# Patient Record
Sex: Male | Born: 1965 | Hispanic: No | Smoking: Never smoker
Health system: Southern US, Community
[De-identification: ages and names within clinical notes are randomized; demographics above are authoritative.]

## PROBLEM LIST (undated history)

## (undated) HISTORY — PX: FRACTURE SURGERY: SHX138

---

## 1999-10-12 ENCOUNTER — Encounter: Payer: Self-pay | Admitting: Emergency Medicine

## 1999-10-12 ENCOUNTER — Emergency Department (HOSPITAL_COMMUNITY): Admission: EM | Admit: 1999-10-12 | Discharge: 1999-10-12 | Payer: Self-pay | Admitting: Emergency Medicine

## 2005-06-28 ENCOUNTER — Ambulatory Visit: Payer: Self-pay | Admitting: Internal Medicine

## 2012-01-30 ENCOUNTER — Ambulatory Visit (INDEPENDENT_AMBULATORY_CARE_PROVIDER_SITE_OTHER): Payer: BC Managed Care – PPO | Admitting: Family Medicine

## 2012-01-30 VITALS — BP 118/70 | HR 102 | Temp 98.2°F | Resp 16 | Ht 71.0 in | Wt 174.0 lb

## 2012-01-30 DIAGNOSIS — E785 Hyperlipidemia, unspecified: Secondary | ICD-10-CM

## 2012-01-30 LAB — POCT CBC
Granulocyte percent: 68.2 %G (ref 37–80)
HCT, POC: 49.4 % (ref 43.5–53.7)
Hemoglobin: 15.7 g/dL (ref 14.1–18.1)
Lymph, poc: 2.1 (ref 0.6–3.4)
MCH, POC: 29.5 pg (ref 27–31.2)
MCHC: 31.8 g/dL (ref 31.8–35.4)
MCV: 92.9 fL (ref 80–97)
MID (cbc): 0.7 (ref 0–0.9)
MPV: 8.5 fL (ref 0–99.8)
POC Granulocyte: 5.9 (ref 2–6.9)
POC LYMPH PERCENT: 23.9 %L (ref 10–50)
POC MID %: 7.9 %M (ref 0–12)
Platelet Count, POC: 235 10*3/uL (ref 142–424)
RBC: 5.32 M/uL (ref 4.69–6.13)
RDW, POC: 13 %
WBC: 8.7 10*3/uL (ref 4.6–10.2)

## 2012-01-30 LAB — COMPREHENSIVE METABOLIC PANEL
ALT: 20 U/L (ref 0–53)
AST: 19 U/L (ref 0–37)
Albumin: 4.6 g/dL (ref 3.5–5.2)
Alkaline Phosphatase: 79 U/L (ref 39–117)
BUN: 12 mg/dL (ref 6–23)
CO2: 31 mEq/L (ref 19–32)
Calcium: 10.1 mg/dL (ref 8.4–10.5)
Chloride: 102 mEq/L (ref 96–112)
Creat: 1.13 mg/dL (ref 0.50–1.35)
Glucose, Bld: 79 mg/dL (ref 70–99)
Potassium: 4.4 mEq/L (ref 3.5–5.3)
Sodium: 140 mEq/L (ref 135–145)
Total Bilirubin: 1.1 mg/dL (ref 0.3–1.2)
Total Protein: 7.6 g/dL (ref 6.0–8.3)

## 2012-01-30 LAB — LIPID PANEL
Cholesterol: 165 mg/dL (ref 0–200)
HDL: 53 mg/dL (ref >39)
LDL Cholesterol: 98 mg/dL (ref 0–99)
Total CHOL/HDL Ratio: 3.1 Ratio
Triglycerides: 70 mg/dL (ref <150)
VLDL: 14 mg/dL (ref 0–40)

## 2012-01-30 NOTE — Progress Notes (Signed)
Here to have labs drawn for a CPE- he plans to have his physical in August.   We have been working to improve his cholesterol profile- he tends to have a very low HDL, but he has improved his LDL a lot with lifestyle changes.    1. Hyperlipidemia  POCT CBC, Comprehensive metabolic panel, TSH, Lipid panel, PSA   Ordered labs as above.  Will follow up with labs when available.

## 2012-01-31 LAB — PSA: PSA: 0.67 ng/mL (ref <=4.00)

## 2012-01-31 LAB — TSH: TSH: 1.282 u[IU]/mL (ref 0.350–4.500)

## 2012-02-01 ENCOUNTER — Encounter: Payer: Self-pay | Admitting: Family Medicine

## 2012-02-24 ENCOUNTER — Encounter: Payer: Self-pay | Admitting: Family Medicine

## 2012-02-24 ENCOUNTER — Ambulatory Visit (INDEPENDENT_AMBULATORY_CARE_PROVIDER_SITE_OTHER): Payer: BC Managed Care – PPO | Admitting: Family Medicine

## 2012-02-24 VITALS — BP 114/58 | HR 73 | Temp 98.6°F | Resp 16 | Ht 71.5 in | Wt 181.6 lb

## 2012-02-24 DIAGNOSIS — Z0000 Encounter for general adult medical examination without abnormal findings: Secondary | ICD-10-CM

## 2012-02-24 DIAGNOSIS — I4510 Unspecified right bundle-branch block: Secondary | ICD-10-CM

## 2012-02-24 NOTE — Progress Notes (Signed)
[facility] [address] [place] [address] [phone]  Date:  02/24/2012   Name:  Patrick Larsen   DOB:  Sep 01, 1965   MRN:  [id number]  PCP:  Abbe Amsterdam, MD    Chief Complaint: Annual Exam   History of Present Illness:  Patrick Larsen is a 46 y.o. very pleasant male patient who presents with the following:  Here today for a CPE.  He has a history of hyperlipidemia, but he has controlled this with diet and exercise.  He is a Stage manager, and also works in Location manager.  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  He is married and has a 51 year old daughter.    Patrick Larsen has no particular concerns today, but he would like to have an EKG for his records.  Her thinks that he may have been told that his EKG was abnormal in the past, but he has never had any syncope/ near syncope or CP.   Tetanus is UTD, and labs done at his visit last month .  There is no problem list on file for this patient.   No past medical history on file.  No past surgical history on file.  History  Substance Use Topics  . Smoking status: Never Smoker   . Smokeless tobacco: Not on file  . Alcohol Use: Not on file    No family history on file.  No Known Allergies  Medication list has been reviewed and updated.  Current Outpatient Prescriptions on File Prior to Visit  Medication Sig Dispense Refill  . Multiple Vitamin (MULTIVITAMIN) tablet Take 3 tablets by mouth daily.      . Omega-3 Fatty Acids (FISH OIL CONCENTRATE) 1000 MG CAPS Take 2,000 capsules by mouth daily.      . vitamin C (ASCORBIC ACID) 500 MG tablet Take 500 mg by mouth daily.        Review of Systems:  As per HPI- otherwise negative.   Physical Examination: Filed Vitals:   02/24/12 1320  BP: 114/58  Pulse: 73  Temp: 98.6 F (37 C)  Resp: 16   Filed Vitals:   02/24/12 1320  Height: 5' 11.5" (1.816 m)  Weight: 181 lb 9.6 oz (82.373 kg)   Body mass index is 24.97  kg/(m^2). Ideal Body Weight: Weight in (lb) to have BMI = 25: 181.4   GEN: WDWN, NAD, Non-toxic, A & O x 3 HEENT: Atraumatic, Normocephalic. Neck supple. No masses, No LAD.  TM and oropharynx wnl, PEERL, EOMI Ears and Nose: No external deformity. CV: RRR, No M/G/R. No JVD. No thrill. No extra heart sounds. PULM: CTA B, no wheezes, crackles, rhonchi. No retractions. No resp. distress. No accessory muscle use. ABD: S, NT, ND, +BS. No rebound. No HSM. EXTR: No c/c/e NEURO Normal gait.  PSYCH: Normally interactive. Conversant. Not depressed or anxious appearing.  Calm demeanor.  GU: normal penis and scrotum/ normal DRE  Labs from last month:   Office Visit on 01/30/2012  Component Date Value Range Status  . WBC 01/30/2012 8.7  4.6 - 10.2 K/uL Final  . Lymph, poc 01/30/2012 2.1  0.6 - 3.4 Final  . POC LYMPH PERCENT 01/30/2012 23.9  10 - 50 %L Final  . MID (cbc) 01/30/2012 0.7  0 - 0.9 Final  . POC MID % 01/30/2012 7.9  0 - 12 %M Final  . POC Granulocyte 01/30/2012 5.9  2 - 6.9 Final  . Granulocyte percent 01/30/2012 68.2  37 - 80 %G Final  . RBC 01/30/2012 5.32  4.69 - 6.13 M/uL Final  . Hemoglobin 01/30/2012 15.7  14.1 - 18.1 g/dL Final  . HCT, POC 16/04/9603 49.4  43.5 - 53.7 % Final  . MCV 01/30/2012 92.9  80 - 97 fL Final  . MCH, POC 01/30/2012 29.5  27 - 31.2 pg Final  . MCHC 01/30/2012 31.8  31.8 - 35.4 g/dL Final  . RDW, POC 54/03/8118 13.0   Final  . Platelet Count, POC 01/30/2012 235  142 - 424 K/uL Final  . MPV 01/30/2012 8.5  0 - 99.8 fL Final  . Sodium 01/30/2012 140  135 - 145 mEq/L Final  . Potassium 01/30/2012 4.4  3.5 - 5.3 mEq/L Final  . Chloride 01/30/2012 102  96 - 112 mEq/L Final  . CO2 01/30/2012 31  19 - 32 mEq/L Final  . Glucose, Bld 01/30/2012 79  70 - 99 mg/dL Final  . BUN 14/78/2956 12  6 - 23 mg/dL Final  . Creat 21/30/8657 1.13  0.50 - 1.35 mg/dL Final  . Total Bilirubin 01/30/2012 1.1  0.3 - 1.2 mg/dL Final  . Alkaline Phosphatase 01/30/2012 79  39 -  117 U/L Final  . AST 01/30/2012 19  0 - 37 U/L Final  . ALT 01/30/2012 20  0 - 53 U/L Final  . Total Protein 01/30/2012 7.6  6.0 - 8.3 g/dL Final  . Albumin 84/69/6295 4.6  3.5 - 5.2 g/dL Final  . Calcium 28/41/3244 10.1  8.4 - 10.5 mg/dL Final  . TSH 07/17/7251 1.282  0.350 - 4.500 uIU/mL Final  . Cholesterol 01/30/2012 165  0 - 200 mg/dL Final   Comment: ATP III Classification:                                < 200        mg/dL        Desirable                               200 - 239     mg/dL        Borderline High                               >= 240        mg/dL        High                             . Triglycerides 01/30/2012 70  <150 mg/dL Final  . HDL 66/44/0347 53  >39 mg/dL Final  . Total CHOL/HDL Ratio 01/30/2012 3.1   Final  . VLDL 01/30/2012 14  0 - 40 mg/dL Final  . LDL Cholesterol 01/30/2012 98  0 - 99 mg/dL Final   Comment:                            Total Cholesterol/HDL Ratio:CHD Risk                                                 Coronary Heart Disease  Risk Table                                                                 Men       Women                                   1/2 Average Risk              3.4        3.3                                       Average Risk              5.0        4.4                                    2X Average Risk              9.6        7.1                                    3X Average Risk             23.4       11.0                          Use the calculated Patient Ratio above and the CHD Risk table                           to determine the patient's CHD Risk.                          ATP III Classification (LDL):                                < 100        mg/dL         Optimal                               100 - 129     mg/dL         Near or Above Optimal                               130 - 159     mg/dL         Borderline High                               160 - 189     mg/dL  High                                >  190        mg/dL         Very High                             . PSA 01/30/2012 0.67  <=4.00 ng/mL Final   Comment: Test Methodology: ECLIA PSA (Electrochemiluminescence Immunoassay)                                                     For PSA values from 2.5-4.0, particularly in younger men <60 years                          old, the AUA and NCCN suggest testing for % Free PSA (3515) and                          evaluation of the rate of increase in PSA (PSA velocity).   EKG: sinus rhythm, RBBB  Assessment and Plan: 1. Physical exam, annual  EKG 12-Lead   Performed age- appropriate exam today.  Patrick Larsen is concerned about his EKG findings today.  As he is very active he would like to have further evaluation to be sure there is nothing to be concerned about.  Will refer non- urgently to cardiology for an evaluation.    Abbe Amsterdam, MD

## 2012-06-29 ENCOUNTER — Telehealth: Payer: Self-pay | Admitting: Radiology

## 2012-06-29 NOTE — Telephone Encounter (Signed)
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 Please see me, I have the form. Alinah Sheard

## 2012-07-01 ENCOUNTER — Telehealth: Payer: Self-pay | Admitting: Family Medicine

## 2012-07-01 ENCOUNTER — Telehealth: Payer: Self-pay | Admitting: *Deleted

## 2012-07-01 NOTE — Telephone Encounter (Signed)
Patrick Larsen would like for [place] to complete a PD physical agility permission form.  He has actually done this exam several times in the past.  We were going to refer him to cardiology per his request for an EKG abnormality earlier this year.  He has no history of CP even with vigorous exercise or of CAD.  Dr. Patty Sermons at [facility] was kind enough to look at his EKG- BBB, does not need further evaluation as long as no symptoms.  Will complete form for [name] today and let him know.

## 2012-07-01 NOTE — Telephone Encounter (Signed)
Pt informed to come pick up physician statement form.

## 2012-07-15 HISTORY — PX: FRACTURE SURGERY: SHX138

## 2012-10-01 ENCOUNTER — Telehealth: Payer: Self-pay

## 2012-10-01 NOTE — Telephone Encounter (Signed)
Wants to know if something on his EKG would have an effect on a polygraph test.    [phone]

## 2012-10-01 NOTE — Telephone Encounter (Signed)
Patient has bundle branch block right which shows up on EKG< wants to know if this would have any effects on the outcome of polygraph. I think not, please advise.

## 2012-10-02 NOTE — Telephone Encounter (Signed)
Not that I am aware of. Just tell the truth.

## 2012-10-02 NOTE — Telephone Encounter (Signed)
Patient advised.

## 2012-11-10 ENCOUNTER — Other Ambulatory Visit: Payer: Self-pay | Admitting: Occupational Medicine

## 2012-11-10 ENCOUNTER — Ambulatory Visit
Admission: RE | Admit: 2012-11-10 | Discharge: 2012-11-10 | Disposition: A | Discharge status: Home / Self Care | Payer: No Typology Code available for payment source | Source: Ambulatory Visit | Attending: Occupational Medicine | Admitting: Occupational Medicine

## 2012-11-10 DIAGNOSIS — Z021 Encounter for pre-employment examination: Secondary | ICD-10-CM

## 2013-05-31 ENCOUNTER — Ambulatory Visit
Admission: RE | Admit: 2013-05-31 | Discharge: 2013-05-31 | Disposition: A | Discharge status: Home / Self Care | Payer: 59 | Source: Ambulatory Visit | Attending: Family | Admitting: Family

## 2013-05-31 ENCOUNTER — Other Ambulatory Visit: Payer: Self-pay | Admitting: Family

## 2013-05-31 DIAGNOSIS — R52 Pain, unspecified: Secondary | ICD-10-CM

## 2013-06-28 ENCOUNTER — Ambulatory Visit: Payer: Self-pay

## 2013-06-28 ENCOUNTER — Other Ambulatory Visit: Payer: Self-pay | Admitting: Occupational Medicine

## 2013-06-28 DIAGNOSIS — R52 Pain, unspecified: Secondary | ICD-10-CM

## 2015-08-04 ENCOUNTER — Encounter: Payer: Self-pay | Admitting: Family Medicine

## 2015-08-09 ENCOUNTER — Encounter: Payer: Self-pay | Admitting: Family Medicine

## 2015-08-28 ENCOUNTER — Ambulatory Visit (INDEPENDENT_AMBULATORY_CARE_PROVIDER_SITE_OTHER): Payer: Commercial Managed Care - HMO | Admitting: Family Medicine

## 2015-08-28 VITALS — BP 136/79 | HR 92 | Temp 99.0°F | Resp 20 | Ht 72.0 in | Wt 202.6 lb

## 2015-08-28 DIAGNOSIS — G8929 Other chronic pain: Secondary | ICD-10-CM | POA: Diagnosis not present

## 2015-08-28 DIAGNOSIS — M5432 Sciatica, left side: Secondary | ICD-10-CM

## 2015-08-28 DIAGNOSIS — M545 Low back pain, unspecified: Secondary | ICD-10-CM | POA: Insufficient documentation

## 2015-08-28 NOTE — Progress Notes (Signed)
   Subjective:    Patient ID: Patrick Larsen, male    DOB: September 19, 1965, 51 y.o.   MRN: [id number] By signing my name below, I, Littie Deeds, attest that this documentation has been prepared under the direction and in the presence of Elvina Sidle, MD.  Electronically Signed: Littie Deeds, Medical Scribe. 08/28/2015. 11:56 AM.  HPI HPI Comments: Patrick Larsen is a 50 y.o. male who presents to the [facility] complaining of gradual onset, intermittent, left lower back pain radiating into his left leg that started about a year ago. 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 The pain is not worse with palpation. Patient also exercises through [employer] and also works on cardio and core exercises. He would prefer not to miss any work and would like to try a duty belt suspension system that would place more of the weight onto his shoulders.   Review of Systems  Musculoskeletal: Positive for back pain.       Objective:   Physical Exam CONSTITUTIONAL: Well developed/well nourished HEAD: Normocephalic/atraumatic EYES: EOM/PERRL ENMT: Mucous membranes moist NECK: supple no meningeal signs SPINE: Straightly leg raising positive at 80 degrees on the left only. Non-tender on his back. Spine shows no scoliotic changes. CV: S1/S2 noted, no murmurs/rubs/gallops noted LUNGS: Lungs are clear to auscultation bilaterally, no apparent distress ABDOMEN: soft, nontender, no rebound or guarding GU: no cva tenderness NEURO: Pt is awake/alert, moves all extremitiesx4. Strength and muscle tone are normal on the left and right. EXTREMITIES: pulses normal, full ROM SKIN: warm, color normal PSYCH: no abnormalities of mood noted     Assessment & Plan:  Duty belt suspension system and return in 2 weeks.This chart was scribed in my presence and reviewed by me personally. This chart was scribed in my  presence and reviewed by me personally.    ICD-9-CM ICD-10-CM   1. Chronic low back pain 724.2 M54.5    338.29 G89.29   2. Sciatica of left side 724.3 M54.32      Signed, Elvina Sidle, MD   Signed, Elvina Sidle, MD

## 2015-08-28 NOTE — Patient Instructions (Signed)
Let's try the duty belt suspension system for the next 2 weeks and then have you return for reevaluation with Dr. Patsy Lager.

## 2015-09-06 ENCOUNTER — Ambulatory Visit (INDEPENDENT_AMBULATORY_CARE_PROVIDER_SITE_OTHER): Payer: Commercial Managed Care - HMO | Admitting: Family Medicine

## 2015-09-06 ENCOUNTER — Encounter: Payer: Self-pay | Admitting: Family Medicine

## 2015-09-06 VITALS — BP 128/76 | HR 71 | Temp 98.2°F | Ht 72.0 in | Wt 202.2 lb

## 2015-09-06 DIAGNOSIS — M5432 Sciatica, left side: Secondary | ICD-10-CM | POA: Diagnosis not present

## 2015-09-06 NOTE — Progress Notes (Signed)
[facility] [address] [place], [place] [address] [phone]  Date:  09/06/2015   Name:  Patrick Larsen   DOB:  1965/12/02   MRN:  [id number]  PCP:  Abbe Amsterdam, MD    Chief Complaint: Back Pain   History of Present Illness:  Patrick Larsen is a 50 y.o. very pleasant male patient who presents with the following:  I have seen this pt in the past although it has been a few years.  recently seen at [facility] (10 days ago) with complaint of back pain. 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 He was having pain in his lower back and down his left leg At that time we tried a belt suspension system to help take the weight off his lower back  He feels that he is much better with this system.  He has used some aleve for his back- he used it for 6 days but no longer feels that he needs it  He does need a note to RTW at regular duty and to be allowed to use his belt system  He no longer has any signs of sciatica, no bowel or bladder dysfunction Overall he is doing great  Patient Active Problem List   Diagnosis Date Noted  . Chronic low back pain 08/28/2015    History reviewed. No pertinent past medical history.  Past Surgical History  Procedure Laterality Date  . Fracture surgery      Social History  Substance Use Topics  . Smoking status: Never Smoker   . Smokeless tobacco: None  . Alcohol Use: None    Family History  Problem Relation Age of Onset  . Cancer Mother     No Known Allergies  Medication list has been reviewed and updated.  Current Outpatient Prescriptions on File Prior to Visit  Medication Sig Dispense Refill  . Multiple Vitamin (MULTIVITAMIN) tablet Take 3 tablets by mouth daily.    . Omega-3 Fatty Acids (FISH OIL CONCENTRATE) 1000 MG CAPS Take 2,000 capsules by mouth daily.    . vitamin C (ASCORBIC ACID) 500 MG tablet Take 500 mg by mouth  daily.     No current facility-administered medications on file prior to visit.    Review of Systems:  As per HPI- otherwise negative.   Physical Examination: Filed Vitals:   09/06/15 1132  BP: 128/76  Pulse: 71  Temp: 98.2 F (36.8 C)   Filed Vitals:   09/06/15 1132  Height: 6' (1.829 m)  Weight: 202 lb 3.2 oz (91.717 kg)   Body mass index is 27.42 kg/(m^2). Ideal Body Weight: Weight in (lb) to have BMI = 25: 183.9  GEN: WDWN, NAD, Non-toxic, A & O x 3 HEENT: Atraumatic, Normocephalic. Neck supple. No masses, No LAD. Ears and Nose: No external deformity. CV: RRR, No M/G/R. No JVD. No thrill. No extra heart sounds. PULM: CTA B, no wheezes, crackles, rhonchi. No retractions. No resp. distress. No accessory muscle use. EXTR: No c/c/e NEURO Normal gait.  PSYCH: Normally interactive. Conversant. Not depressed or anxious appearing.  Calm demeanor.  He indicates the left lower back and left sciatic notch as the areas that were bothering him- however he no longer has any tenderness.  Normal lumbar flexion and extension, normal BLE strength, sensation and DTR, negative SLR bilaterally    Assessment and Plan: Sciatica of left side  He is much better, sx are resolved nearly.  He will return to full duty and continue to use the belt system He will see me soon for a CPE  Signed Abbe Amsterdam, MD

## 2015-09-06 NOTE — Patient Instructions (Signed)
It was good to see you again today- let me know if your back symptoms return!  Otherwise we are glad to see you for a physical at your convenience

## 2015-12-07 ENCOUNTER — Ambulatory Visit (INDEPENDENT_AMBULATORY_CARE_PROVIDER_SITE_OTHER): Payer: Commercial Managed Care - HMO | Admitting: Family Medicine

## 2015-12-07 ENCOUNTER — Encounter: Payer: Self-pay | Admitting: Family Medicine

## 2015-12-07 VITALS — BP 136/73 | HR 71 | Temp 98.1°F | Ht 72.0 in | Wt 199.0 lb

## 2015-12-07 DIAGNOSIS — Z130 Encounter for screening for diseases of the blood and blood-forming organs and certain disorders involving the immune mechanism: Secondary | ICD-10-CM

## 2015-12-07 DIAGNOSIS — Z131 Encounter for screening for diabetes mellitus: Secondary | ICD-10-CM

## 2015-12-07 DIAGNOSIS — Z0000 Encounter for general adult medical examination without abnormal findings: Secondary | ICD-10-CM

## 2015-12-07 DIAGNOSIS — Z13220 Encounter for screening for lipoid disorders: Secondary | ICD-10-CM

## 2015-12-07 DIAGNOSIS — Z125 Encounter for screening for malignant neoplasm of prostate: Secondary | ICD-10-CM | POA: Diagnosis not present

## 2015-12-07 LAB — COMPREHENSIVE METABOLIC PANEL
ALBUMIN: 4.3 g/dL (ref 3.5–5.2)
ALK PHOS: 72 U/L (ref 39–117)
ALT: 19 U/L (ref 0–53)
AST: 20 U/L (ref 0–37)
BILIRUBIN TOTAL: 0.6 mg/dL (ref 0.2–1.2)
BUN: 11 mg/dL (ref 6–23)
CO2: 32 mEq/L (ref 19–32)
CREATININE: 1.26 mg/dL (ref 0.40–1.50)
Calcium: 9.7 mg/dL (ref 8.4–10.5)
Chloride: 102 mEq/L (ref 96–112)
GFR: 78.2 mL/min (ref >60.00)
Glucose, Bld: 81 mg/dL (ref 70–99)
Potassium: 3.7 mEq/L (ref 3.5–5.1)
SODIUM: 138 meq/L (ref 135–145)
TOTAL PROTEIN: 6.7 g/dL (ref 6.0–8.3)

## 2015-12-07 LAB — PSA: PSA: 0.59 ng/mL (ref 0.10–4.00)

## 2015-12-07 LAB — CBC
HCT: 43.5 % (ref 39.0–52.0)
Hemoglobin: 14.3 g/dL (ref 13.0–17.0)
MCHC: 32.8 g/dL (ref 30.0–36.0)
MCV: 89.7 fl (ref 78.0–100.0)
PLATELETS: 220 10*3/uL (ref 150.0–400.0)
RBC: 4.85 Mil/uL (ref 4.22–5.81)
RDW: 13.9 % (ref 11.5–15.5)
WBC: 7.3 10*3/uL (ref 4.0–10.5)

## 2015-12-07 LAB — HEMOGLOBIN A1C: HEMOGLOBIN A1C: 5.3 % (ref 4.6–6.5)

## 2015-12-07 LAB — LIPID PANEL
CHOLESTEROL: 179 mg/dL (ref 0–200)
HDL: 50.1 mg/dL (ref >39.00)
LDL CALC: 117 mg/dL — AB (ref 0–99)
NonHDL: 128.76
Total CHOL/HDL Ratio: 4
Triglycerides: 58 mg/dL (ref 0.0–149.0)
VLDL: 11.6 mg/dL (ref 0.0–40.0)

## 2015-12-07 NOTE — Progress Notes (Signed)
Pre visit review using our clinic review tool, if applicable. No additional management support is needed unless otherwise documented below in the visit note. 

## 2015-12-07 NOTE — Patient Instructions (Signed)
It was great to see you today- I will be in touch with your labs but it seems that you are doing great- keep up the good work!

## 2015-12-07 NOTE — Progress Notes (Signed)
[facility] [address] [place], [place] [id number] [phone] [phone]  Date:  12/07/2015   Name:  Patrick AdieSean M Feinstein   DOB:  03/22/1966   MRN:  [id number]  PCP:  Abbe AmsterdamOPLAND,Pierre Dellarocco, MD    Chief Complaint: Annual Exam   History of Present Illness:  Patrick Larsen is a 50 y.o. very pleasant male patient who presents with the following:  Here today for a physical exam. 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 Chronic low back pain 08/28/2015    No past medical history on file.  Past Surgical History  Procedure Laterality Date  . Fracture surgery      Social History  Substance Use Topics  . Smoking status: Never Smoker   . Smokeless tobacco: None  . Alcohol Use: None    Family History  Problem Relation Age of Onset  . Cancer Mother     No Known Allergies  Medication list has been reviewed and updated.  Current Outpatient Prescriptions on File Prior to Visit  Medication Sig Dispense Refill  . CALCIUM-MAGNESIUM-VITAMIN D PO Take 2 tablets by mouth daily.    . Multiple Vitamin (MULTIVITAMIN) tablet Take 3 tablets by mouth daily.    . Multiple Vitamin (THERA) TABS Take 2 tablets by mouth daily.    . Potassium 99 MG TABS Take 1 tablet by mouth daily.    . Probiotic Product (ACIDOPHILUS/GOAT MILK) CAPS Take 2 capsules by mouth daily.    . saw palmetto 500 MG  capsule Take 500 mg by mouth daily.    . vitamin C (ASCORBIC ACID) 500 MG tablet Take 500 mg by mouth daily.     No current facility-administered medications on file prior to visit.    Review of Systems:  As per HPI- otherwise negative.   Physical Examination: Filed Vitals:   12/07/15 1033  BP: 136/73  Pulse: 71  Temp: 98.1 F (36.7 C)   Filed Vitals:   12/07/15 1033  Height: 6' (1.829 m)  Weight: 199 lb (90.266 kg)   Body mass index is 26.98 kg/(m^2). Ideal Body Weight: Weight in (lb) to have BMI = 25: 183.9  GEN: WDWN, NAD, Non-toxic, A & O x 3, looks well, fit build HEENT: Atraumatic, Normocephalic. Neck supple. No masses, No LAD.  Bilateral TM wnl, oropharynx normal.  PEERL,EOMI.   Ears and Nose: No external deformity. CV: RRR, No M/G/R. No JVD. No thrill. No extra heart sounds. PULM: CTA B, no wheezes, crackles, rhonchi. No retractions. No resp. distress. No accessory muscle use. ABD: S, NT, ND. No rebound. No HSM. EXTR: No c/c/e NEURO Normal gait.  PSYCH: Normally interactive. Conversant. Not depressed or anxious appearing.  Calm demeanor.  Normal testicles and penis, normal DRE  Assessment and Plan:  Physical exam  Screening for diabetes mellitus - Plan: Comprehensive metabolic panel, Hemoglobin A1c  Screening for hyperlipidemia - Plan: Lipid panel  Screening for deficiency anemia - Plan: CBC  Screening for prostate cancer - Plan: PSA   Here today for a CPE He has a healthy lifestyle and no concerns today Encouraged him to continue exercise and healthy diet Labs today- Will plan further follow- up pending labs.  Signed Abbe Amsterdam, MD

## 2016-02-22 ENCOUNTER — Ambulatory Visit (INDEPENDENT_AMBULATORY_CARE_PROVIDER_SITE_OTHER): Payer: Commercial Managed Care - HMO | Admitting: Medical

## 2016-02-22 ENCOUNTER — Encounter: Payer: Self-pay | Admitting: Medical

## 2016-02-22 ENCOUNTER — Ambulatory Visit (HOSPITAL_BASED_OUTPATIENT_CLINIC_OR_DEPARTMENT_OTHER)
Admission: RE | Admit: 2016-02-22 | Discharge: 2016-02-22 | Disposition: A | Discharge status: Home / Self Care | Payer: Commercial Managed Care - HMO | Source: Ambulatory Visit | Attending: Medical | Admitting: Medical

## 2016-02-22 VITALS — BP 126/70 | HR 77 | Temp 98.0°F | Ht 72.0 in | Wt 204.8 lb

## 2016-02-22 DIAGNOSIS — M25572 Pain in left ankle and joints of left foot: Secondary | ICD-10-CM | POA: Insufficient documentation

## 2016-02-22 MED ORDER — DICLOFENAC SODIUM 75 MG PO TBEC
75.0000 mg | DELAYED_RELEASE_TABLET | Freq: Two times a day (BID) | ORAL | 0 refills | Status: DC
Start: 2016-02-22 — End: 2016-10-18

## 2016-02-22 NOTE — Progress Notes (Signed)
Pre visit review using our clinic review tool, if applicable. No additional management support is needed unless otherwise documented below in the visit note. 

## 2016-02-22 NOTE — Progress Notes (Signed)
   Subjective:    Patient ID: Patrick Larsen, male    DOB: 1965-12-22, 50 y.o.   MRN: [id number]  HPI  Pt in for evaluation of ankle pain. Pt was jogging yesterday. He describes inversion injury. He had to stop the jog. Pt can apply pressure but describes hobbled gait. Pt is Emergency planning/management officerpolice officer.   Pt took ibuprofen last night.     Review of Systems  Constitutional: Negative for chills, fatigue and fever.  Musculoskeletal:       Lt ankle pain lateral aspect.  Hematological: Negative for adenopathy. Bruises/bleeds easily.   No past medical history on file.   Social History   Social History  . Marital status: Married    Spouse name: N/A  . Number of children: N/A  . Years of education: N/A   Occupational History  . Not on file.   Social History Main Topics  . Smoking status: Never Smoker  . Smokeless tobacco: Not on file  . Alcohol use Not on file  . Drug use: Unknown  . Sexual activity: Not on file   Other Topics Concern  . Not on file   Social History Narrative  . No narrative on file    Past Surgical History:  Procedure Laterality Date  . FRACTURE SURGERY      Family History  Problem Relation Age of Onset  . Cancer Mother     No Known Allergies  Current Outpatient Prescriptions on File Prior to Visit  Medication Sig Dispense Refill  . CALCIUM-MAGNESIUM-VITAMIN D PO Take 2 tablets by mouth daily.    . Cholecalciferol (VITAMIN D3) 5000 units TABS Take 1 tablet by mouth daily.    . Multiple Vitamin (MULTIVITAMIN) tablet Take 3 tablets by mouth daily.    . Multiple Vitamin (THERA) TABS Take 2 tablets by mouth daily.    . Potassium 99 MG TABS Take 1 tablet by mouth daily.    . Probiotic Product (ACIDOPHILUS/GOAT MILK) CAPS Take 2 capsules by mouth daily.    . saw palmetto 500 MG capsule Take 500 mg by mouth daily.    . vitamin C (ASCORBIC ACID) 500 MG tablet Take 500 mg by mouth daily.     No current facility-administered medications on file prior to visit.       BP 126/70 (BP Location: Right Arm, Patient Position: Sitting, Cuff Size: Normal)   Pulse 77   Temp 98 F (36.7 C) (Oral)   Ht 6' (1.829 m)   Wt 204 lb 12.8 oz (92.9 kg)   SpO2 98%   BMI 27.78 kg/m       Objective:   Physical Exam  General- No acute distress. Pleasant patient.  Lt ankle- no some faint pain on rom lateral aspect. Lt foot- most of pt pain is proximal foot/4th and 5th metatarsal area. Mild swollen/faint bruise.          Assessment & Plan:  For your left ankle/foot region pain will get x-rays.  Will rx diclofenac.  RICE therapy.  Follow up  7 days or as needed.  Off work for  tomorrow. Return to regular duty on Wednesday. 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

## 2016-02-22 NOTE — Patient Instructions (Addendum)
For your left ankle/foot region pain will get x-rays.  Will rx diclofenac.  RICE therapy.  Follow up  7 days or as needed.

## 2016-02-22 NOTE — Progress Notes (Signed)
Pt has seen results on MyChart and message also sent for patient to call back if any questions.

## 2016-02-26 NOTE — Progress Notes (Signed)
Pt has seen results on MyChart and message also sent for patient to call back if any questions.

## 2016-02-28 ENCOUNTER — Encounter: Payer: Self-pay | Admitting: Medical

## 2016-02-28 ENCOUNTER — Ambulatory Visit (INDEPENDENT_AMBULATORY_CARE_PROVIDER_SITE_OTHER): Payer: Commercial Managed Care - HMO | Admitting: Medical

## 2016-02-28 VITALS — BP 126/61 | HR 68 | Temp 98.7°F | Ht 72.0 in | Wt 205.8 lb

## 2016-02-28 DIAGNOSIS — M25572 Pain in left ankle and joints of left foot: Secondary | ICD-10-CM | POA: Diagnosis not present

## 2016-02-28 NOTE — Progress Notes (Signed)
Pre visit review using our clinic tool,if applicable. No additional management support is needed unless otherwise documented below in the visit note.  

## 2016-02-28 NOTE — Progress Notes (Signed)
Subjective:    Patient ID: Patrick AdieSean M Larsen, male    DOB: 1966/02/17, 50 y.o.   MRN: [id number]  HPI   Pt in for follow up. He states he has been able to put some pressure on his left ankle. He tried to do light job/run. Pt states when he simulates run movements since last visit had level 5/10 pain.   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  Injury was on August 9th. Xray was done on the 10 th.  Pain described as in base of 4th metatarsal area.     Review of Systems  Constitutional: Negative for chills, fatigue and fever.  Musculoskeletal:       Left foot pain.  Skin: Negative for rash.  Hematological: Negative for adenopathy. Does not bruise/bleed easily.    No past medical history on file.   Social History   Social History  . Marital status: Married    Spouse name: N/A  . Number of children: N/A  . Years of education: N/A   Occupational History  . Not on file.   Social History Main Topics  . Smoking status: Never Smoker  . Smokeless tobacco: Not on file  . Alcohol use Not on file  . Drug use: Unknown  . Sexual activity: Not on file   Other Topics Concern  . Not on file   Social History Narrative  . No narrative on file    Past Surgical History:  Procedure Laterality Date  . FRACTURE SURGERY      Family History  Problem Relation Age of Onset  . Cancer Mother     No Known Allergies  Current Outpatient Prescriptions on File Prior to Visit  Medication Sig Dispense Refill  . CALCIUM-MAGNESIUM-VITAMIN D PO Take 2 tablets by mouth daily.    . Cholecalciferol (VITAMIN D3) 5000 units TABS Take 1 tablet by mouth daily.    . Multiple Vitamin (MULTIVITAMIN) tablet Take 3 tablets by mouth daily.    . Multiple Vitamin (THERA) TABS Take 2 tablets by mouth daily.    . Potassium 99 MG TABS Take 1 tablet by mouth daily.    . Probiotic Product (ACIDOPHILUS/GOAT MILK) CAPS Take 2 capsules by mouth daily.    .  saw palmetto 500 MG capsule Take 500 mg by mouth daily.    . vitamin C (ASCORBIC ACID) 500 MG tablet Take 500 mg by mouth daily.    . diclofenac (VOLTAREN) 75 MG EC tablet Take 1 tablet (75 mg total) by mouth 2 (two) times daily. (Patient not taking: Reported on 02/28/2016) 14 tablet 0   No current facility-administered medications on file prior to visit.     BP 126/61   Pulse 68   Temp 98.7 F (37.1 C) (Oral)   Ht 6' (1.829 m)   Wt 205 lb 12.8 oz (93.4 kg)   SpO2 100%   BMI 27.91 kg/m       Objective:   Physical Exam  General- nad.  Lt ankle- no swelling. No pain on palpation. No warmth.  Lt foot- mild- moderate pain on palpation proximal 4th metatarsal area. No bruising.       Assessment & Plan:  For your foot pain continue RICE therapy. Can use diclofenac if needed  Repeat xray of left foot this Friday to see if small stress fracture missed on initial xray in light of persisting pain.  Light duty excuse.  Refer to  sports medicine.  Follow up as needed with us after sports med referral.   Esperanza RichtersSaguier, Yandiel Bergum, PA-C

## 2016-02-28 NOTE — Patient Instructions (Addendum)
For your foot pain continue RICE therapy. Can use diclofenac if needed  Repeat xray of left foot this Friday to see if small stress fracture missed on initial xray in light of persisting pain.  Light duty excuse.  Refer to sports medicine.  Follow up as needed with us after sports med referral.

## 2016-02-29 ENCOUNTER — Ambulatory Visit (HOSPITAL_BASED_OUTPATIENT_CLINIC_OR_DEPARTMENT_OTHER)
Admission: RE | Admit: 2016-02-29 | Discharge: 2016-02-29 | Disposition: A | Discharge status: Home / Self Care | Payer: Commercial Managed Care - HMO | Source: Ambulatory Visit | Attending: Medical | Admitting: Medical

## 2016-02-29 ENCOUNTER — Ambulatory Visit (INDEPENDENT_AMBULATORY_CARE_PROVIDER_SITE_OTHER): Payer: Commercial Managed Care - HMO | Admitting: Family Medicine

## 2016-02-29 ENCOUNTER — Encounter: Payer: Self-pay | Admitting: Family Medicine

## 2016-02-29 DIAGNOSIS — S99922A Unspecified injury of left foot, initial encounter: Secondary | ICD-10-CM

## 2016-02-29 DIAGNOSIS — M25572 Pain in left ankle and joints of left foot: Secondary | ICD-10-CM | POA: Diagnosis present

## 2016-02-29 NOTE — Patient Instructions (Addendum)
You have a foot sprain (between cuboid and 5th metatarsal). Ice the area 15 minutes at a time 3-4 times a day. Try to avoid flat shoes, barefoot walking until this heals. Ibuprofen or aleve only if needed. Light duty for 2 weeks - you can call me sooner if you're feeling well, able to run and cut with minimal pain about a week from now and we can return you to full duty. Follow up with me in 2 weeks.

## 2016-03-04 DIAGNOSIS — S99922A Unspecified injury of left foot, initial encounter: Secondary | ICD-10-CM | POA: Insufficient documentation

## 2016-03-04 NOTE — Progress Notes (Signed)
PCP: Abbe AmsterdamOPLAND,JESSICA, MD  Consultation requested by: Esperanza RichtersEdward Saguier PA-C  Subjective:   HPI: Patient is a 50 y.o. male here for left foot pain.  Patient reports on 8/9 he was out jogging when he inverted his left ankle. Difficulty bearing weight after this but able to do so soon after this. Took 400mg  ibuprofen for 2 days but no medicines now. Focusing on rest, icing, elevation, compression. History of remote injury around 1988 to this ankle. Radiographs by PCP on 8/10 were negative. 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 Pain level 5/10 at worst, sharp lateral left foot. No skin changes, numbness.  No past medical history on file.  Current Outpatient Prescriptions on File Prior to Visit  Medication Sig Dispense Refill  . CALCIUM-MAGNESIUM-VITAMIN D PO Take 2 tablets by mouth daily.    . Cholecalciferol (VITAMIN D3) 5000 units TABS Take 1 tablet by mouth daily.    . diclofenac (VOLTAREN) 75 MG EC tablet Take 1 tablet (75 mg total) by mouth 2 (two) times daily. (Patient not taking: Reported on 02/28/2016) 14 tablet 0  . Multiple Vitamin (MULTIVITAMIN) tablet Take 3 tablets by mouth daily.    . Multiple Vitamin (THERA) TABS Take 2 tablets by mouth daily.    . Potassium 99 MG TABS Take 1 tablet by mouth daily.    . Probiotic Product (ACIDOPHILUS/GOAT MILK) CAPS Take 2 capsules by mouth daily.    . saw palmetto 500 MG capsule Take 500 mg by mouth daily.    . vitamin C (ASCORBIC ACID) 500 MG tablet Take 500 mg by mouth daily.     No current facility-administered medications on file prior to visit.     Past Surgical History:  Procedure Laterality Date  . FRACTURE SURGERY      No Known Allergies  Social History   Social History  . Marital status: Married    Spouse name: N/A  . Number of children: N/A  . Years of education: N/A   Occupational History  . Not on file.   Social History Main Topics  . Smoking status: Never Smoker  . Smokeless tobacco: Not on file  .  Alcohol use Not on file  . Drug use: Unknown  . Sexual activity: Not on file   Other Topics Concern  . Not on file   Social History Narrative  . No narrative on file    Family History  Problem Relation Age of Onset  . Cancer Mother     BP (!) 147/84   Ht 6\' 1"  (1.854 m)   Wt 205 lb (93 kg)   BMI 27.05 kg/m   Review of Systems: See HPI above.    Objective:  Physical Exam:  Gen: NAD, comfortable in exam room  Left foot/ankle: No gross deformity, swelling, ecchymoses FROM with mild pain external rotation. TTP base 5th metatarsal, TMT joint in this area.  No other tenderness. Negative ant drawer and talar tilt.   Negative syndesmotic compression. Thompsons test negative. NV intact distally.  Right ankle: FROM without pain.    MSK u/s left foot/ankle:  No abnormalities of 5th metatarsal, cuboid, peroneal tendons.  Assessment & Plan:  1. Left foot injury - Independently reviewed today's radiographs; performed and reviewed ultrasound also - no fracture, tendon abnormalities.  Consistent with midfoot sprain.  Icing, ibuprofen or aleve if needed.  Written for light duty for 2 more weeks though discussed may be improved prior to this.  F/u in 2 weeks.

## 2016-03-04 NOTE — Assessment & Plan Note (Signed)
Independently reviewed today's radiographs; performed and reviewed ultrasound also - no fracture, tendon abnormalities.  Consistent with midfoot sprain.  Icing, ibuprofen or aleve if needed.  Written for light duty for 2 more weeks though discussed may be improved prior to this.  F/u in 2 weeks.

## 2016-03-06 ENCOUNTER — Telehealth: Payer: Self-pay | Admitting: Family Medicine

## 2016-03-06 NOTE — Telephone Encounter (Signed)
Letter printed.

## 2016-10-18 ENCOUNTER — Ambulatory Visit (INDEPENDENT_AMBULATORY_CARE_PROVIDER_SITE_OTHER): Payer: Commercial Managed Care - HMO | Admitting: Medical

## 2016-10-18 ENCOUNTER — Encounter: Payer: Self-pay | Admitting: Medical

## 2016-10-18 VITALS — BP 134/67 | HR 74 | Temp 98.6°F | Resp 16 | Ht 73.0 in | Wt 197.8 lb

## 2016-10-18 DIAGNOSIS — M25569 Pain in unspecified knee: Secondary | ICD-10-CM | POA: Diagnosis not present

## 2016-10-18 DIAGNOSIS — S80212A Abrasion, left knee, initial encounter: Secondary | ICD-10-CM

## 2016-10-18 DIAGNOSIS — L089 Local infection of the skin and subcutaneous tissue, unspecified: Secondary | ICD-10-CM

## 2016-10-18 MED ORDER — MUPIROCIN 2 % EX OINT: TOPICAL_OINTMENT | CUTANEOUS | 0 refills | Status: DC

## 2016-10-18 NOTE — Patient Instructions (Addendum)
The abrasion area does appear to have possible  infection. Recommended  doxycycline oral antibiotic but declined. Did rx mupirocin but in event area worsens oral antibiotic would be required Strong caution given since over a joint.   For pain can use ibuprofen otc.  If you change you mind on xray let me know and will order.  When return to work cover with bandaid and ace wrap the area.   Follow up in 10 days or as needed.

## 2016-10-18 NOTE — Progress Notes (Signed)
Pre visit review using our clinic review tool, if applicable. No additional management support is needed unless otherwise documented below in the visit note. 

## 2016-10-18 NOTE — Progress Notes (Signed)
Subjective:    Patient ID: Patrick Larsen, male    DOB: 1965-12-09, 51 y.o.   MRN: [id number]  HPI  Pt in with mild  knee pain. Pt states he had injury walking his dog. He got dog leash wrapped around his leg. His 170 lb dog chased a ball and Maximilien hit the ground knee first.  Since then his knee hs been healing slowly. No severe pain. But he constantly re-cracks scab on range of motion. Recent faint dry yellow dc over scab.  Injury was one week ago.    Review of Systems  Constitutional: Negative for chills, fatigue and fever.  Respiratory: Negative for cough, chest tightness, shortness of breath and wheezing.   Cardiovascular: Negative for chest pain and palpitations.  Gastrointestinal: Negative for abdominal pain, constipation, nausea and vomiting.  Genitourinary: Negative for dysuria and flank pain.  Musculoskeletal:       Lt knee pain.  Skin: Negative for rash.  Neurological: Negative for dizziness, weakness and headaches.  Hematological: Negative for adenopathy. Does not bruise/bleed easily.    No past medical history on file.   Social History   Social History  . Marital status: Married    Spouse name: N/A  . Number of children: N/A  . Years of education: N/A   Occupational History  . Not on file.   Social History Main Topics  . Smoking status: Never Smoker  . Smokeless tobacco: Never Used  . Alcohol use Not on file  . Drug use: Unknown  . Sexual activity: Not on file   Other Topics Concern  . Not on file   Social History Narrative  . No narrative on file    Past Surgical History:  Procedure Laterality Date  . FRACTURE SURGERY      Family History  Problem Relation Age of Onset  . Cancer Mother     No Known Allergies  Current Outpatient Prescriptions on File Prior to Visit  Medication Sig Dispense Refill  . CALCIUM-MAGNESIUM-VITAMIN D PO Take 2 tablets by mouth daily.    . Cholecalciferol (VITAMIN D3) 5000 units TABS Take 1 tablet by mouth  daily.    . Multiple Vitamin (MULTIVITAMIN) tablet Take 3 tablets by mouth daily.    . Multiple Vitamin (THERA) TABS Take 2 tablets by mouth daily.    . Potassium 99 MG TABS Take 1 tablet by mouth daily.    . Probiotic Product (ACIDOPHILUS/GOAT MILK) CAPS Take 2 capsules by mouth daily.    . saw palmetto 500 MG capsule Take 500 mg by mouth daily.    . vitamin C (ASCORBIC ACID) 500 MG tablet Take 500 mg by mouth daily.     No current facility-administered medications on file prior to visit.     BP 134/67 (BP Location: Right Arm, Patient Position: Sitting, Cuff Size: Large)   Pulse 74   Temp 98.6 F (37 C) (Oral)   Resp 16   Ht  (1.854 m)   Wt 197 lb 12.8 oz (89.7 kg)   SpO2 95%   BMI 26.10 kg/m       Objective:   Physical Exam  General- No acute distress. Pleasant patient. Neck- Full range of motion, no jvd Lungs- Clear, even and unlabored. Heart- regular rate and rhythm. Neurologic- CNII- XII grossly intact.  Left knee- 1.5 cm x .5 cm scab mid patella. With faint redness at edges.(pt shows me picture of orignial wound and was a deep wound) Good rom. He has mild  pain directly over patella when moves. No instability.     Assessment & Plan:  The abrasion area does appear to have possible  infection. Recommended  doxycycline oral antibiotic but declined. Did rx mupirocin but in event area worsens oral antibiotic would be required Strong caution given since over a joint.   For pain can use ibuprofen otc.  If you change you mind on xray let me know and will order.  When return to work cover with bandaid and ace wrap the area.   Follow up in 10 days or as needed.

## 2017-01-07 NOTE — Progress Notes (Addendum)
[facility] [address] [place], [place] [address] [phone]  Date:  01/08/2017   Name:  Patrick Larsen   DOB:  17-Jun-1966   MRN:  [id number]  PCP:  Pearline Cables, MD    Chief Complaint: Annual Exam   History of Present Illness:  Patrick Larsen is a 51 y.o. 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  He does have some persistent lower back pain.  HPI from last year:  Here today for a physical exam. He is using a special suspension belt for his service belt at work- this has helped a lot with his back pain  His tetanus shot is UTD He is due for labs today- he is fasting since last night His family is doing well  He does check his BP at home and will generally get 120/68; he tends to get a little nervous at MD office He enjoys exercising at the gym; cardio and weights  He is still working for the [employer] His duty belt system does worry well for him His family is doing great- his daughter is starting 3rd grade this coming year.  She has a lot of interest in outer space and geography  They are traveling to [place] to see family in July.   He continues to be physically active and to eat well No CP or SOB  He did eat a little yogurt and banana early this am- is otherwise fasting for labs   He does have some physical aches and pains from his years of football and wrestling  He has noted a mole on his right face for the last couple of years which is a bit concerning to   His home BP continues to look very good- 120- 125/60s  Wt Readings from Last 3 Encounters:  01/08/17 197 lb 9.6 oz (89.6 kg)  10/18/16 197 lb 12.8 oz (89.7 kg)  02/29/16 205 lb (93 kg)   Lab Results  Component Value Date   PSA 0.59 12/07/2015   PSA 0.67 01/30/2012     Patient Active Problem List   Diagnosis Date Noted  . Injury of  left foot 03/04/2016  . Chronic low back pain 08/28/2015    No past medical history on file.  Past Surgical History:  Procedure Laterality Date  . FRACTURE SURGERY      Social History  Substance Use Topics  . Smoking status: Never Smoker  . Smokeless tobacco: Never Used  . Alcohol use Not on file    Family History  Problem Relation Age of Onset  . Cancer Mother     No Known Allergies  Medication list has been reviewed and updated.  Current Outpatient Prescriptions on File Prior to Visit  Medication Sig Dispense Refill  . CALCIUM-MAGNESIUM-VITAMIN D PO Take 2 tablets by mouth daily.    . Cholecalciferol (VITAMIN D3) 5000 units TABS Take 1 tablet by mouth daily.    . Multiple Vitamin (MULTIVITAMIN) tablet Take 3 tablets by mouth daily.    . Multiple Vitamin (THERA) TABS Take 2 tablets by mouth daily.    . Potassium 99 MG TABS Take 1 tablet by mouth daily.    . Probiotic Product (ACIDOPHILUS/GOAT MILK) CAPS Take 2 capsules by mouth daily.    . saw palmetto 500 MG capsule Take 500 mg by mouth daily.    Marland Kitchen  vitamin C (ASCORBIC ACID) 500 MG tablet Take 500 mg by mouth daily.     No current facility-administered medications on file prior to visit.     Review of Systems:  As per HPI- otherwise negative.   Physical Examination: Vitals:   01/08/17 1411  BP: 129/68  Pulse: 82  Temp: 98.7 F (37.1 C)   Vitals:   01/08/17 1411  Weight: 197 lb 9.6 oz (89.6 kg)  Height: 6\' 1"  (1.854 m)   Body mass index is 26.07 kg/m. Ideal Body Weight: Weight in (lb) to have BMI = 25: 189.1  GEN: WDWN, NAD, Non-toxic, A & O x 3, looks well today HEENT: Atraumatic, Normocephalic. Neck supple. No masses, No LAD.  Bilateral TM wnl, oropharynx normal.  PEERL,EOMI.   Ears and Nose: No external deformity. CV: RRR, No M/G/R. No JVD. No thrill. No extra heart sounds. PULM: CTA B, no wheezes, crackles, rhonchi. No retractions. No resp. distress. No accessory muscle use. ABD: S, NT, ND, +BS.  No rebound. No HSM. EXTR: No c/c/e NEURO Normal gait.  PSYCH: Normally interactive. Conversant. Not depressed or anxious appearing.  Calm demeanor.    Assessment and Plan: Physical exam  Screening for diabetes mellitus - Plan: Comprehensive metabolic panel, Hemoglobin A1c  Screening for hyperlipidemia - Plan: Lipid panel  Screening for deficiency anemia - Plan: CBC  Screening for prostate cancer - Plan: PSA  Here today for his annual CPE He is feeling well and has no concerns except for a small mole inferior to right ear, present for a couple of years.  He plans to come in to have this removed soon Otherwise encouraged continued healthy lifestyle, Will plan further follow- up pending labs.   Signed Abbe Amsterdam, MD  Labs returned as below Results for orders placed or performed in visit on 01/08/17  CBC  Result Value Ref Range   WBC 7.2 4.0 - 10.5 K/uL   RBC 4.63 4.22 - 5.81 Mil/uL   Platelets 219.0 150.0 - 400.0 K/uL   Hemoglobin 14.0 13.0 - 17.0 g/dL   HCT 16.1 09.6 - 04.5 %   MCV 88.9 78.0 - 100.0 fl   MCHC 34.0 30.0 - 36.0 g/dL   RDW 40.9 81.1 - 91.4 %  Comprehensive metabolic panel  Result Value Ref Range   Sodium 140 135 - 145 mEq/L   Potassium 4.2 3.5 - 5.1 mEq/L   Chloride 102 96 - 112 mEq/L   CO2 31 19 - 32 mEq/L   Glucose, Bld 78 70 - 99 mg/dL   BUN 10 6 - 23 mg/dL   Creatinine, Ser 7.82 0.40 - 1.50 mg/dL   Total Bilirubin 0.9 0.2 - 1.2 mg/dL   Alkaline Phosphatase 59 39 - 117 U/L   AST 21 0 - 37 U/L   ALT 18 0 - 53 U/L   Total Protein 6.7 6.0 - 8.3 g/dL   Albumin 4.4 3.5 - 5.2 g/dL   Calcium 95.6 8.4 - 21.3 mg/dL   GFR 08.65 >78.46 mL/min  Hemoglobin A1c  Result Value Ref Range   Hgb A1c MFr Bld 5.2 4.6 - 6.5 %  Lipid panel  Result Value Ref Range   Cholesterol 182 0 - 200 mg/dL   Triglycerides 96.2 0.0 - 149.0 mg/dL   HDL 95.28 >41.32 mg/dL   VLDL 44.0 0.0 - 10.2 mg/dL   LDL Cholesterol 725 (H) 0 - 99 mg/dL   Total CHOL/HDL Ratio 4     NonHDL 133.13   PSA  Result Value Ref Range   PSA 0.75 0.10 - 4.00 ng/mL   Lab Results  Component Value Date   PSA 0.75 01/08/2017   PSA 0.59 12/07/2015   PSA 0.67 01/30/2012

## 2017-01-08 ENCOUNTER — Ambulatory Visit (INDEPENDENT_AMBULATORY_CARE_PROVIDER_SITE_OTHER): Payer: 59 | Admitting: Family Medicine

## 2017-01-08 ENCOUNTER — Encounter: Payer: Self-pay | Admitting: Family Medicine

## 2017-01-08 VITALS — BP 129/68 | HR 82 | Temp 98.7°F | Ht 73.0 in | Wt 197.6 lb

## 2017-01-08 DIAGNOSIS — Z0000 Encounter for general adult medical examination without abnormal findings: Secondary | ICD-10-CM

## 2017-01-08 DIAGNOSIS — Z13220 Encounter for screening for lipoid disorders: Secondary | ICD-10-CM | POA: Diagnosis not present

## 2017-01-08 DIAGNOSIS — Z131 Encounter for screening for diabetes mellitus: Secondary | ICD-10-CM

## 2017-01-08 DIAGNOSIS — Z130 Encounter for screening for diseases of the blood and blood-forming organs and certain disorders involving the immune mechanism: Secondary | ICD-10-CM

## 2017-01-08 DIAGNOSIS — Z125 Encounter for screening for malignant neoplasm of prostate: Secondary | ICD-10-CM | POA: Diagnosis not present

## 2017-01-08 NOTE — Patient Instructions (Addendum)
It was a pleasure to see you today- I will be in touch with your labs asap  Continue to take good care of yourself!  Please schedule a 30 minute appt with me in the next few months and we can remove that mole from your face  I would recommend that you have colon cancer screening- Cologuard or colonoscopy are both good options.  Please think about this and let me know your preference I will then set up your testing

## 2017-01-09 ENCOUNTER — Encounter: Payer: Self-pay | Admitting: Family Medicine

## 2017-01-09 LAB — CBC
HEMATOCRIT: 41.2 % (ref 39.0–52.0)
HEMOGLOBIN: 14 g/dL (ref 13.0–17.0)
MCHC: 34 g/dL (ref 30.0–36.0)
MCV: 88.9 fl (ref 78.0–100.0)
Platelets: 219 10*3/uL (ref 150.0–400.0)
RBC: 4.63 Mil/uL (ref 4.22–5.81)
RDW: 13.1 % (ref 11.5–15.5)
WBC: 7.2 10*3/uL (ref 4.0–10.5)

## 2017-01-09 LAB — HEMOGLOBIN A1C: Hgb A1c MFr Bld: 5.2 % (ref 4.6–6.5)

## 2017-01-09 LAB — LIPID PANEL
CHOL/HDL RATIO: 4
Cholesterol: 182 mg/dL (ref 0–200)
HDL: 48.6 mg/dL (ref >39.00)
LDL Cholesterol: 115 mg/dL — ABNORMAL HIGH (ref 0–99)
NONHDL: 133.13
TRIGLYCERIDES: 90 mg/dL (ref 0.0–149.0)
VLDL: 18 mg/dL (ref 0.0–40.0)

## 2017-01-09 LAB — COMPREHENSIVE METABOLIC PANEL
ALK PHOS: 59 U/L (ref 39–117)
ALT: 18 U/L (ref 0–53)
AST: 21 U/L (ref 0–37)
Albumin: 4.4 g/dL (ref 3.5–5.2)
BUN: 10 mg/dL (ref 6–23)
CHLORIDE: 102 meq/L (ref 96–112)
CO2: 31 meq/L (ref 19–32)
Calcium: 10.3 mg/dL (ref 8.4–10.5)
Creatinine, Ser: 1.36 mg/dL (ref 0.40–1.50)
GFR: 71.29 mL/min (ref >60.00)
GLUCOSE: 78 mg/dL (ref 70–99)
POTASSIUM: 4.2 meq/L (ref 3.5–5.1)
SODIUM: 140 meq/L (ref 135–145)
TOTAL PROTEIN: 6.7 g/dL (ref 6.0–8.3)
Total Bilirubin: 0.9 mg/dL (ref 0.2–1.2)

## 2017-01-09 LAB — PSA: PSA: 0.75 ng/mL (ref 0.10–4.00)

## 2017-04-22 NOTE — Progress Notes (Deleted)
[facility] at [facility] [address], [address] [place], [place] [address] [phone]  Date:  04/23/2017   Name:  Patrick Larsen   DOB:  Jan 29, 1966   MRN:  [id number]  PCP:  Pearline Cables, MD    Chief Complaint: No chief complaint on file.   History of Present Illness:  Patrick Larsen is a 51 y.o. very pleasant male patient who presents with the following:  Here today to have a mole removed from the right side of his face Flu shot: due He is also now of age for a colonoscopy   Patient Active Problem List   Diagnosis Date Noted  . Injury of left foot 03/04/2016  . Chronic low back pain 08/28/2015    No past medical history on file.  Past Surgical History:  Procedure Laterality Date  . FRACTURE SURGERY      Social History  Substance Use Topics  . Smoking status: Never Smoker  . Smokeless tobacco: Never Used  . Alcohol use Not on file    Family History  Problem Relation Age of Onset  . Cancer Mother     No Known Allergies  Medication list has been reviewed and updated.  Current Outpatient Prescriptions on File Prior to Visit  Medication Sig Dispense Refill  . CALCIUM-MAGNESIUM-VITAMIN D PO Take 2 tablets by mouth daily.    . Cholecalciferol (VITAMIN D3) 5000 units TABS Take 1 tablet by mouth daily.    . Multiple Vitamin (MULTIVITAMIN) tablet Take 3 tablets by mouth daily.    . Multiple Vitamin (THERA) TABS Take 2 tablets by mouth daily.    . Omega-3 Fatty Acids (THE VERY FINEST FISH OIL) LIQD Take by mouth.    . Potassium 99 MG TABS Take 1 tablet by mouth daily.    . Probiotic Product (ACIDOPHILUS/GOAT MILK) CAPS Take 2 capsules by mouth daily.    . saw palmetto 500 MG capsule Take 500 mg by mouth daily.    . vitamin C (ASCORBIC ACID) 500 MG tablet Take 500 mg by mouth daily.     No current facility-administered medications on file prior to visit.     Review of Systems:  As per HPI- otherwise  negative.   Physical Examination: There were no vitals filed for this visit. There were no vitals filed for this visit. There is no height or weight on file to calculate BMI. Ideal Body Weight:    GEN: WDWN, NAD, Non-toxic, A & O x 3 HEENT: Atraumatic, Normocephalic. Neck supple. No masses, No LAD. Ears and Nose: No external deformity. CV: RRR, No M/G/R. No JVD. No thrill. No extra heart sounds. PULM: CTA B, no wheezes, crackles, rhonchi. No retractions. No resp. distress. No accessory muscle use. ABD: S, NT, ND, +BS. No rebound. No HSM. EXTR: No c/c/e NEURO Normal gait.  PSYCH: Normally interactive. Conversant. Not depressed or anxious appearing.  Calm demeanor.    Assessment and Plan: ***  Signed Abbe Amsterdam, MD

## 2017-04-23 ENCOUNTER — Ambulatory Visit: Payer: Self-pay | Admitting: Family Medicine

## 2017-04-27 NOTE — Progress Notes (Signed)
[facility] [address] [place], [place] [address] [phone]  Date:  04/28/2017   Name:  Patrick Larsen   DOB:  04/02/1966   MRN:  [id number]  PCP:  Pearline Cables, MD    Chief Complaint: Nevus (removal)   History of Present Illness:  Patrick Larsen is a 51 y.o. very pleasant male patient who presents with the following:  Would like to have a small mole on his right angle of jaw removed today It has been there for a few months- he was just a bit concerned about it and would like it to be removed.   No history of skin cancer  Also he has noted a left shoulder pain for the last few weeks- he has noted some aching in his shoulder, and it will seem to get stuck in place at time.  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 He now avoids lifting heavy weights, but does use kettlebells and nautilus machines to stay fit   Patient Active Problem List   Diagnosis Date Noted  . Injury of left foot 03/04/2016  . Chronic low back pain 08/28/2015    No past medical history on file.  Past Surgical History:  Procedure Laterality Date  . FRACTURE SURGERY      Social History  Substance Use Topics  . Smoking status: Never Smoker  . Smokeless tobacco: Never Used  . Alcohol use Not on file    Family History  Problem Relation Age of Onset  . Cancer Mother     No Known Allergies  Medication list has been reviewed and updated.  Current Outpatient Prescriptions on File Prior to Visit  Medication Sig Dispense Refill  . CALCIUM-MAGNESIUM-VITAMIN D PO Take 2 tablets by mouth daily.    . Cholecalciferol (VITAMIN D3) 5000 units TABS Take 1 tablet by mouth daily.    . Multiple Vitamin (MULTIVITAMIN)  tablet Take 3 tablets by mouth daily.    . Multiple Vitamin (THERA) TABS Take 2 tablets by mouth daily.    . Omega-3 Fatty Acids (THE VERY FINEST FISH OIL) LIQD Take by mouth.    . Potassium 99 MG TABS Take 1 tablet by mouth daily.    . Probiotic Product (ACIDOPHILUS/GOAT MILK) CAPS Take 2 capsules by mouth daily.    . saw palmetto 500 MG capsule Take 500 mg by mouth daily.    . vitamin C (ASCORBIC ACID) 500 MG tablet Take 500 mg by mouth daily.     No current facility-administered medications on file prior to visit.     Review of Systems:  As per HPI- otherwise negative.   Physical Examination: Vitals:   04/28/17 1402  BP: 133/85  Pulse: 94  Temp: 98.3 F (36.8 C)  SpO2: 100%   Vitals:   04/28/17 1402  Weight: 197 lb 3.2 oz (89.4 kg)  Height:  (1.854 m)   Body mass index is 26.02 kg/m. Ideal Body Weight: Weight in (lb) to have BMI = 25: 189.1  GEN: WDWN, NAD, Non-toxic, A & O x 3 HEENT: Atraumatic, Normocephalic. Neck supple. No masses, No LAD. Ears and Nose: No external deformity. CV: RRR, No M/G/R.  No JVD. No thrill. No extra heart sounds. PULM: CTA B, no wheezes, crackles, rhonchi. No retractions. No resp. distress. No accessory muscle use. ABD: S, NT, ND, +BS. No rebound. No HSM. EXTR: No c/c/e NEURO Normal gait.  PSYCH: Normally interactive. Conversant. Not depressed or anxious appearing.  Calm demeanor.  He has a small- approx 2-60mm diameter- dark lesion on the right angle of jaw.  Suspect it is a seborrheic keratosis Left shoulder: tenderness at the anterior RCT insertion He notes some discomfort with external and internal rotation, and with impingement maneuvers.  Normal strength and ROM however   VC obtained.  Prepped skin lesion with betadine and then alcohol.  Anesthesia with a small wheal of 1% lido.  Traction with forceps and removed lesion with dermablade.  Removed completely, specimen to pathology    Assessment and Plan: Skin lesion - Plan:  Dermatology pathology  Chronic left shoulder pain  Here today for a skin lesion removal- this is done, await pathology Shoulder pain- suspect rotator cuff tendonitis. Offered ortho referral- for the time being he declines, he will rest his shoulder and let me know if his sx continue   Signed Abbe Amsterdam, MD

## 2017-04-28 ENCOUNTER — Other Ambulatory Visit (HOSPITAL_COMMUNITY)
Admission: RE | Admit: 2017-04-28 | Discharge: 2017-04-28 | Disposition: A | Discharge status: Home / Self Care | Payer: 59 | Source: Ambulatory Visit | Attending: Family Medicine | Admitting: Family Medicine

## 2017-04-28 ENCOUNTER — Encounter: Payer: Self-pay | Admitting: Family Medicine

## 2017-04-28 ENCOUNTER — Ambulatory Visit (INDEPENDENT_AMBULATORY_CARE_PROVIDER_SITE_OTHER): Payer: 59 | Admitting: Family Medicine

## 2017-04-28 VITALS — BP 133/85 | HR 94 | Temp 98.3°F | Ht 73.0 in | Wt 197.2 lb

## 2017-04-28 DIAGNOSIS — L821 Other seborrheic keratosis: Secondary | ICD-10-CM | POA: Insufficient documentation

## 2017-04-28 DIAGNOSIS — M25512 Pain in left shoulder: Secondary | ICD-10-CM | POA: Diagnosis not present

## 2017-04-28 DIAGNOSIS — L989 Disorder of the skin and subcutaneous tissue, unspecified: Secondary | ICD-10-CM

## 2017-04-28 DIAGNOSIS — G8929 Other chronic pain: Secondary | ICD-10-CM

## 2017-04-28 NOTE — Patient Instructions (Signed)
Always a pleasure to see you!  I will be in touch with your pathology report. I think you have a rotator cuff strain in your right shoulder.   Avoid any overhead lifts, and if your shoulder is not feeling better in a few days let me know and I can refer you to a good shoulder specialist.

## 2017-05-01 ENCOUNTER — Encounter: Payer: Self-pay | Admitting: Family Medicine

## 2017-05-06 ENCOUNTER — Telehealth: Payer: Self-pay | Admitting: *Deleted

## 2017-05-06 NOTE — Telephone Encounter (Signed)
Received results from Surgical Pathology; forwarded to provider/SLS 10/23

## 2018-01-23 NOTE — Progress Notes (Addendum)
[facility]care at [facility] [address], [address] [place], [place] [address] [phone]  Date:  01/26/2018   Name:  Patrick Larsen   DOB:  March 25, 1966   MRN:  [id number]  PCP:  Pearline Cables, MD    Chief Complaint: No chief complaint on file.   History of Present Illness:  Patrick Larsen is a 52 y.o. very pleasant male patient who presents with the following:  Here today for a CPE- from our visit about a year ago:  Emergency planning/management officer, generally in great health.  He does have some persistent lower back pain.  Here today for a physical exam. He is using a special suspension belt for his service belt at work- this has helped a lot with his back pain  Labs: a year ago, fasting now except for water  Colon: needs screening. 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 The equipment is much lighter which helps his back.  He is enjoying his new job, it is less stressful and safer, and he has paid time for exercise during the work day  His family is well- daughter is starting 4th grade this fall  She is doing a Retail buyer camp this summer His daughter and wife went to [place] but he had to work,he did get to [facility] to visit family a few weeks ago   Patient Active Problem List   Diagnosis Date Noted  . Injury of left foot 03/04/2016  . Chronic low back pain 08/28/2015    History reviewed. No pertinent past medical history.  Past Surgical History:  Procedure Laterality Date  . FRACTURE SURGERY      Social History   Tobacco Use  . Smoking status: Never Smoker  . Smokeless tobacco: Never Used  Substance Use Topics  . Alcohol use: Not on file  . Drug use: Not on file    Family History  Problem Relation Age of Onset  . Cancer Mother     No Known Allergies  Medication list has been reviewed and updated.  Current Outpatient Medications on File Prior to Visit   Medication Sig Dispense Refill  . CALCIUM-MAGNESIUM-VITAMIN D PO Take 2 tablets by mouth daily.    . Cholecalciferol (VITAMIN D3) 5000 units TABS Take 1 tablet by mouth daily.    . Multiple Vitamin (MULTIVITAMIN) tablet Take 3 tablets by mouth daily.    . Omega-3 Fatty Acids (THE VERY FINEST FISH OIL) LIQD Take by mouth.    . Potassium 99 MG TABS Take 1 tablet by mouth daily.    . Probiotic Product (ACIDOPHILUS/GOAT MILK) CAPS Take 2 capsules by mouth daily.    . saw palmetto 500 MG capsule Take 500 mg by mouth daily.    . vitamin C (ASCORBIC ACID) 500 MG tablet Take 500 mg by mouth daily.     No current facility-administered medications on file prior to visit.     Review of Systems:  As per HPI- otherwise negative. He continues to exercise quite a bit- he gets paid time at work to exercise No CP or SOB Digestion ok No urinary or genital issues noted  No skin concerns   Physical Examination: Vitals:   01/26/18 1228  BP: 118/80  Pulse: 68  Resp: 18  Temp: 98.1 F (36.7 C)  SpO2: 98%   Vitals:   01/26/18 1228  Weight: 197  lb 11.2 oz (89.7 kg)  Height: 6\' 1"  (1.854 m)   Body mass index is 26.08 kg/m. Ideal Body Weight: Weight in (lb) to have BMI = 25: 189.1  GEN: WDWN, NAD, Non-toxic, A & O x 3, looks well, fit build HEENT: Atraumatic, Normocephalic. Neck supple. No masses, No LAD.  Bilateral TM wnl, oropharynx normal.  PEERL,EOMI.   Ears and Nose: No external deformity. CV: RRR, No M/G/R. No JVD. No thrill. No extra heart sounds. PULM: CTA B, no wheezes, crackles, rhonchi. No retractions. No resp. distress. No accessory muscle use. ABD: S, NT, ND EXTR: No c/c/e NEURO Normal gait.  PSYCH: Normally interactive. Conversant. Not depressed or anxious appearing.  Calm demeanor.    Assessment and Plan: Physical exam  Screening for diabetes mellitus - Plan: Comprehensive metabolic panel, Hemoglobin A1c  Screening for hyperlipidemia - Plan: Lipid panel  Screening for  deficiency anemia - Plan: CBC  Screening for prostate cancer - Plan: PSA  Screening for colon cancer  CPE today Labs pending as above Ordered cologuard for him today Will plan further follow- up pending labs.   Signed Abbe AmsterdamJessica Nicholos Aloisi, MD  Results for orders placed or performed in visit on 01/26/18  CBC  Result Value Ref Range   WBC 6.2 4.0 - 10.5 K/uL   RBC 4.82 4.22 - 5.81 Mil/uL   Platelets 213.0 150.0 - 400.0 K/uL   Hemoglobin 14.6 13.0 - 17.0 g/dL   HCT 16.143.0 09.639.0 - 04.552.0 %   MCV 89.3 78.0 - 100.0 fl   MCHC 34.0 30.0 - 36.0 g/dL   RDW 40.913.2 81.111.5 - 91.415.5 %  Comprehensive metabolic panel  Result Value Ref Range   Sodium 140 135 - 145 mEq/L   Potassium 4.2 3.5 - 5.1 mEq/L   Chloride 102 96 - 112 mEq/L   CO2 32 19 - 32 mEq/L   Glucose, Bld 87 70 - 99 mg/dL   BUN 15 6 - 23 mg/dL   Creatinine, Ser 7.821.20 0.40 - 1.50 mg/dL   Total Bilirubin 1.2 0.2 - 1.2 mg/dL   Alkaline Phosphatase 64 39 - 117 U/L   AST 19 0 - 37 U/L   ALT 17 0 - 53 U/L   Total Protein 6.9 6.0 - 8.3 g/dL   Albumin 4.3 3.5 - 5.2 g/dL   Calcium 9.7 8.4 - 95.610.5 mg/dL   GFR 21.3082.02 >86.57>60.00 mL/min  Hemoglobin A1c  Result Value Ref Range   Hgb A1c MFr Bld 5.3 4.6 - 6.5 %  Lipid panel  Result Value Ref Range   Cholesterol 157 0 - 200 mg/dL   Triglycerides 84.656.0 0.0 - 149.0 mg/dL   HDL 96.2957.30 >52.84>39.00 mg/dL   VLDL 13.211.2 0.0 - 44.040.0 mg/dL   LDL Cholesterol 89 0 - 99 mg/dL   Total CHOL/HDL Ratio 3    NonHDL 99.77   PSA  Result Value Ref Range   PSA 0.79 0.10 - 4.00 ng/mL   Message to pt   Blood counts are normal Metabolic profile looks fine A1c does NOT show any sign of diabetes Cholesterol is very good PSA is normal and stable from years previous- reassuring.   Let's plan to visit in one year, take care!  Lab Results  Component Value Date   PSA 0.79 01/26/2018   PSA 0.75 01/08/2017   PSA 0.59 12/07/2015

## 2018-01-26 ENCOUNTER — Encounter: Payer: Self-pay | Admitting: Family Medicine

## 2018-01-26 ENCOUNTER — Ambulatory Visit (INDEPENDENT_AMBULATORY_CARE_PROVIDER_SITE_OTHER): Payer: BC Managed Care – PPO | Admitting: Family Medicine

## 2018-01-26 VITALS — BP 118/80 | HR 68 | Temp 98.1°F | Resp 18 | Ht 73.0 in | Wt 197.7 lb

## 2018-01-26 DIAGNOSIS — Z131 Encounter for screening for diabetes mellitus: Secondary | ICD-10-CM | POA: Diagnosis not present

## 2018-01-26 DIAGNOSIS — Z0000 Encounter for general adult medical examination without abnormal findings: Secondary | ICD-10-CM

## 2018-01-26 DIAGNOSIS — Z13220 Encounter for screening for lipoid disorders: Secondary | ICD-10-CM | POA: Diagnosis not present

## 2018-01-26 DIAGNOSIS — Z125 Encounter for screening for malignant neoplasm of prostate: Secondary | ICD-10-CM

## 2018-01-26 DIAGNOSIS — Z130 Encounter for screening for diseases of the blood and blood-forming organs and certain disorders involving the immune mechanism: Secondary | ICD-10-CM | POA: Diagnosis not present

## 2018-01-26 DIAGNOSIS — Z1211 Encounter for screening for malignant neoplasm of colon: Secondary | ICD-10-CM

## 2018-01-26 LAB — CBC
HCT: 43 % (ref 39.0–52.0)
HEMOGLOBIN: 14.6 g/dL (ref 13.0–17.0)
MCHC: 34 g/dL (ref 30.0–36.0)
MCV: 89.3 fl (ref 78.0–100.0)
Platelets: 213 10*3/uL (ref 150.0–400.0)
RBC: 4.82 Mil/uL (ref 4.22–5.81)
RDW: 13.2 % (ref 11.5–15.5)
WBC: 6.2 10*3/uL (ref 4.0–10.5)

## 2018-01-26 LAB — COMPREHENSIVE METABOLIC PANEL
ALBUMIN: 4.3 g/dL (ref 3.5–5.2)
ALT: 17 U/L (ref 0–53)
AST: 19 U/L (ref 0–37)
Alkaline Phosphatase: 64 U/L (ref 39–117)
BUN: 15 mg/dL (ref 6–23)
CALCIUM: 9.7 mg/dL (ref 8.4–10.5)
CHLORIDE: 102 meq/L (ref 96–112)
CO2: 32 meq/L (ref 19–32)
Creatinine, Ser: 1.2 mg/dL (ref 0.40–1.50)
GFR: 82.02 mL/min (ref >60.00)
Glucose, Bld: 87 mg/dL (ref 70–99)
POTASSIUM: 4.2 meq/L (ref 3.5–5.1)
SODIUM: 140 meq/L (ref 135–145)
TOTAL PROTEIN: 6.9 g/dL (ref 6.0–8.3)
Total Bilirubin: 1.2 mg/dL (ref 0.2–1.2)

## 2018-01-26 LAB — LIPID PANEL
CHOLESTEROL: 157 mg/dL (ref 0–200)
HDL: 57.3 mg/dL (ref >39.00)
LDL Cholesterol: 89 mg/dL (ref 0–99)
NonHDL: 99.77
Total CHOL/HDL Ratio: 3
Triglycerides: 56 mg/dL (ref 0.0–149.0)
VLDL: 11.2 mg/dL (ref 0.0–40.0)

## 2018-01-26 LAB — PSA: PSA: 0.79 ng/mL (ref 0.10–4.00)

## 2018-01-26 LAB — HEMOGLOBIN A1C: HEMOGLOBIN A1C: 5.3 % (ref 4.6–6.5)

## 2018-01-26 NOTE — Patient Instructions (Signed)
Great to see you today as always!  I will be in touch with your labs, and you will receive a Cologuard kit at home to complete. Keep up the good work with exercise and a healthy lifestyle    Health Maintenance, Male A healthy lifestyle and preventive care is important for your health and wellness. Ask your health care provider about what schedule of regular examinations is right for you. What should I know about weight and diet? Eat a Healthy Diet  Eat plenty of vegetables, fruits, whole grains, low-fat dairy products, and lean protein.  Do not eat a lot of foods high in solid fats, added sugars, or salt.  Maintain a Healthy Weight Regular exercise can help you achieve or maintain a healthy weight. You should:  Do at least 150 minutes of exercise each week. The exercise should increase your heart rate and make you sweat (moderate-intensity exercise).  Do strength-training exercises at least twice a week.  Watch Your Levels of Cholesterol and Blood Lipids  Have your blood tested for lipids and cholesterol every 5 years starting at 51 years of age. If you are at high risk for heart disease, you should start having your blood tested when you are 51 years old. You may need to have your cholesterol levels checked more often if: ? Your lipid or cholesterol levels are high. ? You are older than 51 years of age. ? You are at high risk for heart disease.  What should I know about cancer screening? Many types of cancers can be detected early and may often be prevented. Lung Cancer  You should be screened every year for lung cancer if: ? You are a current smoker who has smoked for at least 30 years. ? You are a former smoker who has quit within the past 15 years.  Talk to your health care provider about your screening options, when you should start screening, and how often you should be screened.  Colorectal Cancer  Routine colorectal cancer screening usually begins at 51 years of age and  should be repeated every 5-10 years until you are 51 years old. You may need to be screened more often if early forms of precancerous polyps or small growths are found. Your health care provider may recommend screening at an earlier age if you have risk factors for colon cancer.  Your health care provider may recommend using home test kits to check for hidden blood in the stool.  A small camera at the end of a tube can be used to examine your colon (sigmoidoscopy or colonoscopy). This checks for the earliest forms of colorectal cancer.  Prostate and Testicular Cancer  Depending on your age and overall health, your health care provider may do certain tests to screen for prostate and testicular cancer.  Talk to your health care provider about any symptoms or concerns you have about testicular or prostate cancer.  Skin Cancer  Check your skin from head to toe regularly.  Tell your health care provider about any new moles or changes in moles, especially if: ? There is a change in a mole's size, shape, or color. ? You have a mole that is larger than a pencil eraser.  Always use sunscreen. Apply sunscreen liberally and repeat throughout the day.  Protect yourself by wearing long sleeves, pants, a wide-brimmed hat, and sunglasses when outside.  What should I know about heart disease, diabetes, and high blood pressure?  If you are 5-25 years of age, have your  blood pressure checked every 3-5 years. If you are 61 years of age or older, have your blood pressure checked every year. You should have your blood pressure measured twice-once when you are at a hospital or clinic, and once when you are not at a hospital or clinic. Record the average of the two measurements. To check your blood pressure when you are not at a hospital or clinic, you can use: ? An automated blood pressure machine at a pharmacy. ? A home blood pressure monitor.  Talk to your health care provider about your target blood  pressure.  If you are between 34-84 years old, ask your health care provider if you should take aspirin to prevent heart disease.  Have regular diabetes screenings by checking your fasting blood sugar level. ? If you are at a normal weight and have a low risk for diabetes, have this test once every three years after the age of 29. ? If you are overweight and have a high risk for diabetes, consider being tested at a younger age or more often.  A one-time screening for abdominal aortic aneurysm (AAA) by ultrasound is recommended for men aged 25-75 years who are current or former smokers. What should I know about preventing infection? Hepatitis B If you have a higher risk for hepatitis B, you should be screened for this virus. Talk with your health care provider to find out if you are at risk for hepatitis B infection. Hepatitis C Blood testing is recommended for:  Everyone born from [birth year] through 1964.  Anyone with known risk factors for hepatitis C.  Sexually Transmitted Diseases (STDs)  You should be screened each year for STDs including gonorrhea and chlamydia if: ? You are sexually active and are younger than 51 years of age. ? You are older than 51 years of age and your health care provider tells you that you are at risk for this type of infection. ? Your sexual activity has changed since you were last screened and you are at an increased risk for chlamydia or gonorrhea. Ask your health care provider if you are at risk.  Talk with your health care provider about whether you are at high risk of being infected with HIV. Your health care provider may recommend a prescription medicine to help prevent HIV infection.  What else can I do?  Schedule regular health, dental, and eye exams.  Stay current with your vaccines (immunizations).  Do not use any tobacco products, such as cigarettes, chewing tobacco, and e-cigarettes. If you need help quitting, ask your health care  provider.  Limit alcohol intake to no more than 2 drinks per day. One drink equals 12 ounces of beer, 5 ounces of wine, or 1 ounces of hard liquor.  Do not use street drugs.  Do not share needles.  Ask your health care provider for help if you need support or information about quitting drugs.  Tell your health care provider if you often feel depressed.  Tell your health care provider if you have ever been abused or do not feel safe at home. This information is not intended to replace advice given to you by your health care provider. Make sure you discuss any questions you have with your health care provider. Document Released: 12/28/2007 Document Revised: 02/28/2016 Document Reviewed: 04/04/2015 Elsevier Interactive Patient Education  [employer].

## 2018-02-01 ENCOUNTER — Encounter: Payer: Self-pay | Admitting: Family Medicine

## 2018-02-01 LAB — COLOGUARD

## 2018-02-16 ENCOUNTER — Encounter: Payer: Self-pay | Admitting: Family Medicine

## 2018-02-16 ENCOUNTER — Telehealth: Payer: Self-pay | Admitting: *Deleted

## 2018-02-16 NOTE — Telephone Encounter (Signed)
Received Cologuard Results; forwarded to provider/SLS 08/05

## 2018-07-15 HISTORY — PX: SHOULDER ARTHROSCOPY W/ ROTATOR CUFF REPAIR: SHX2400

## 2018-10-01 ENCOUNTER — Encounter: Payer: Self-pay | Admitting: Family Medicine

## 2018-10-14 ENCOUNTER — Ambulatory Visit: Payer: BC Managed Care – PPO | Admitting: Family Medicine

## 2018-10-23 ENCOUNTER — Other Ambulatory Visit: Payer: Self-pay

## 2018-10-23 ENCOUNTER — Ambulatory Visit
Admission: EM | Admit: 2018-10-23 | Discharge: 2018-10-23 | Disposition: A | Discharge status: Home / Self Care | Payer: BC Managed Care – PPO | Attending: Physician Assistant | Admitting: Physician Assistant

## 2018-10-23 ENCOUNTER — Encounter: Payer: Self-pay | Admitting: Physician Assistant

## 2018-10-23 DIAGNOSIS — M545 Low back pain, unspecified: Secondary | ICD-10-CM

## 2018-10-23 DIAGNOSIS — M25551 Pain in right hip: Secondary | ICD-10-CM | POA: Diagnosis not present

## 2018-10-23 MED ORDER — DICLOFENAC SODIUM 1 % TD GEL: 2.0000 g | Freq: Four times a day (QID) | TRANSDERMAL | 0 refills | Status: DC

## 2018-10-23 MED ORDER — MELOXICAM 7.5 MG PO TABS: 7.5000 mg | ORAL_TABLET | Freq: Every day | ORAL | 0 refills | Status: DC

## 2018-10-23 NOTE — ED Provider Notes (Signed)
[facility]    CSN: [id number] Arrival date & time: 10/23/18  1531     History   Chief Complaint Chief Complaint  Patient presents with  . Back Pain    HPI Patrick Larsen is a 53 y.o. male.   53 year old male comes in for 5-day history of right hip/lower back pain.  Patient states history of chronic back pain, with intermittent episodes of sciatic nerve for the past 3 to 4 years, and has an episode every 8 months.  No recent injury/trauma.  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 Standing improves the pain, sitting worsens the pain. States some times teaser gun, located on the right belt increased right lateral hip pain. Pain can radiate to the buttock/lateral leg and causes mild toe tingling. Denies saddle anesthesia, loss of bladder or bowel control. He usually takes natural supplements, uses stretches and ice compress to help with the pain. If unresolved, he then started ibuprofen or naproxen. Usually takes ibuprofen 600mg  BID or naproxen 220mg  QD. He has been taking them for the past few days with mild relief.      History reviewed. No pertinent past medical history.  Patient Active Problem List   Diagnosis Date Noted  . Injury of left foot 03/04/2016  . Chronic low back pain 08/28/2015    Past Surgical History:  Procedure Laterality Date  . FRACTURE SURGERY         Home Medications    Prior to Admission medications   Medication Sig Start Date End Date Taking? Authorizing Provider  CALCIUM-MAGNESIUM-VITAMIN D PO Take 2 tablets by mouth daily.    [provider]  Cholecalciferol (VITAMIN D3) 5000 units TABS Take 1 tablet by mouth daily.    [provider]  diclofenac sodium (VOLTAREN) 1 % GEL Apply 2 g topically 4 (four) times daily. 10/23/18   Cathie HoopsYu, Nilay Mangrum V, PA-C  meloxicam (MOBIC) 7.5 MG tablet Take 1 tablet (7.5 mg total) by mouth daily. 10/23/18   Cathie HoopsYu, Karista Aispuro  V, PA-C  Multiple Vitamin (MULTIVITAMIN) tablet Take 3 tablets by mouth daily.    [provider]  Omega-3 Fatty Acids (THE VERY FINEST FISH OIL) LIQD Take by mouth.    [provider]  Potassium 99 MG TABS Take 1 tablet by mouth daily.    [provider]  Probiotic Product (ACIDOPHILUS/GOAT MILK) CAPS Take 2 capsules by mouth daily.    [provider]  saw palmetto 500 MG capsule Take 500 mg by mouth daily.    [provider]  vitamin C (ASCORBIC ACID) 500 MG tablet Take 500 mg by mouth daily.    [provider]    Family History Family History  Problem Relation Age of Onset  . Cancer Mother     Social History Social History   Tobacco Use  . Smoking status: Never Smoker  . Smokeless tobacco: Never Used  Substance Use Topics  . Alcohol use: Not on file  . Drug use: Not on file     Allergies   Patient has no known allergies.   Review of Systems Review of Systems  Reason unable to perform ROS: See HPI as above.     Physical Exam Triage Vital Signs ED Triage Vitals  Enc Vitals Group     BP 10/23/18 1541 (!) 149/83     Pulse Rate 10/23/18 1541 89     Resp 10/23/18  1541 18     Temp 10/23/18 1541 98.7 F (37.1 C)     Temp Source 10/23/18 1541 Oral     SpO2 10/23/18 1541 97 %     Weight --      Height --      Head Circumference --      Peak Flow --      Pain Score 10/23/18 1544 7     Pain Loc --      Pain Edu? --      Excl. in GC? --    No data found.  Updated Vital Signs BP (!) 149/83 (BP Location: Left Arm)   Pulse 89   Temp 98.7 F (37.1 C) (Oral)   Resp 18   SpO2 97%   Physical Exam Constitutional:      General: He is not in acute distress.    Appearance: He is well-developed. He is not diaphoretic.  HENT:     Head: Normocephalic and atraumatic.  Eyes:     Conjunctiva/sclera: Conjunctivae normal.     Pupils: Pupils are equal, round, and reactive to light.  Cardiovascular:     Rate and  Rhythm: Normal rate and regular rhythm.     Heart sounds: Normal heart sounds. No murmur. No friction rub. No gallop.   Pulmonary:     Effort: Pulmonary effort is normal. No accessory muscle usage or respiratory distress.     Breath sounds: Normal breath sounds. No stridor. No decreased breath sounds, wheezing, rhonchi or rales.  Musculoskeletal:     Comments: No tenderness on palpation of the spinous processes. Tenderness to palpation of right low back/hip area. No tenderness to palpation of lateral/anterior hip. Full ROM of back and hips. Strength normal and equal bilaterally. Sensation intact and equal bilaterally. Negative straight leg raise.   Skin:    General: Skin is warm and dry.  Neurological:     Mental Status: He is alert and oriented to person, place, and time.    UC Treatments / Results  Labs (all labs ordered are listed, but only abnormal results are displayed) Labs Reviewed - No data to display  EKG None  Radiology No results found.  Procedures Procedures (including critical care time)  Medications Ordered in UC Medications - No data to display  Initial Impression / Assessment and Plan / UC Course  I have reviewed the triage vital signs and the nursing notes.  Pertinent labs & imaging results that were available during my care of the patient were reviewed by me and considered in my medical decision making (see chart for details).    Patient would like to try topical options. Will provide voltaren gel. Written Rx of mobic provided, can start if voltaren gel is ineffective. Discussed stretches to do. Return precautions given. Patient expresses understanding and agrees to plan.  Final Clinical Impressions(s) / UC Diagnoses   Final diagnoses:  Right hip pain  Acute right-sided low back pain without sciatica    ED Prescriptions    Medication Sig Dispense Auth. Provider   diclofenac sodium (VOLTAREN) 1 % GEL Apply 2 g topically 4 (four) times daily. 1 Tube Maeci Kalbfleisch,  Tavious Griesinger V, PA-C   meloxicam (MOBIC) 7.5 MG tablet Take 1 tablet (7.5 mg total) by mouth daily. 15 tablet Threasa Alpha, [place] 10/23/18 1619

## 2018-10-23 NOTE — Discharge Instructions (Signed)
Start voltaren gel as directed. If symptoms continue, start mobic as directed. Do not take ibuprofen (motrin/advil)/ naproxen (aleve) while on mobic. If experience numbness/tingling of the inner thighs, loss of bladder or bowel control, go to the emergency department for evaluation.

## 2018-10-23 NOTE — ED Triage Notes (Signed)
Pt presents to [facility] for assessment of right lower back, buttocks and leg pain with toe tingling.  Patient states intermittent episodes of sciatic nerve pain x 3-4 years, approx every 8 months or so.  This episode began 5 days ago.  Denies known injury.  States heavy weight lifting and a duty belt at work.

## 2018-10-23 NOTE — ED Notes (Signed)
Patient able to ambulate independently  

## 2018-10-26 ENCOUNTER — Telehealth: Payer: Self-pay

## 2018-10-26 ENCOUNTER — Ambulatory Visit (INDEPENDENT_AMBULATORY_CARE_PROVIDER_SITE_OTHER): Payer: BC Managed Care – PPO | Admitting: Family Medicine

## 2018-10-26 ENCOUNTER — Encounter: Payer: Self-pay | Admitting: Family Medicine

## 2018-10-26 ENCOUNTER — Other Ambulatory Visit: Payer: Self-pay

## 2018-10-26 DIAGNOSIS — M5431 Sciatica, right side: Secondary | ICD-10-CM

## 2018-10-26 MED ORDER — PREDNISONE 20 MG PO TABS: ORAL_TABLET | ORAL | 0 refills | Status: DC

## 2018-10-26 NOTE — Telephone Encounter (Signed)
Copied from CRM #[id number]. Topic: Appointment Scheduling - Prior Auth Required for Appointment >> Oct 23, 2018 12:39 PM [name], Teresa D wrote: No appointment has been scheduled. Patient is requesting sick appointment for nerve pain. Per scheduling protocol, this appointment requires a prior authorization prior to scheduling.  Route to department's PEC pool. 

## 2018-10-26 NOTE — Telephone Encounter (Signed)
Follow up call made to patient states he has has Sciatic nerve pain for the past 7-8 days. Went to UC on Friday and was given Voltaren Gel which he feels has made him have increased muscle cramping. States he has increased his fluids and would like to discuss and alternative to the Voltaren Gel.

## 2018-10-26 NOTE — Telephone Encounter (Signed)
Copied from CRM [id number]. Topic: Appointment Scheduling - Prior Auth Required for Appointment >> Oct 23, 2018 12:39 PM Louie Bun, Rosey Bath D wrote: No appointment has been scheduled. Patient is requesting sick appointment for nerve pain. Per scheduling protocol, this appointment requires a prior authorization prior to scheduling.  Route to department's PEC pool.

## 2018-10-26 NOTE — Telephone Encounter (Signed)
Call and schedule a webex this am?

## 2018-10-26 NOTE — Progress Notes (Signed)
[facility] [address], [address] [place], [place] [address] [phone] [phone]  Date:  10/26/2018   Name:  Patrick AdieSean M Geraldo   DOB:  March 01, 1966   MRN:  [id number]  PCP:  Pearline Cablesopland, Oakley Kossman C, MD    Chief Complaint: No chief complaint on file.   History of Present Illness:  Patrick Larsen is a 53 y.o. very pleasant male patient who presents with the following:  Virtual visit today due to COVID 19 pandemic Pt location is home Provider location is office Pt ID confirmed with name and DOB  Consent obtained for virtual visit today  His family is doing ok, about his daughter Cathlean SauerHarmony is missing school  He has had back pain and sciatica in the past, but that has not bothered him in a few years.  Lower back pain seemed to return about 8 days ago. It was off and on, seemed like it might go away but then it flared up again. The pain is present in his right lower back, just to the right of his tailbone in his glute area.  It will run into his right lateral hip as well  The quad is also painful Some tingling in the right toes.   He did go to UC on 4/10; they gave him some voltaren gel, but it does not seem to help for very long.  Effect is very temporary.   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 Injury of left foot 03/04/2016  . Chronic low back pain 08/28/2015    No past medical history on file.  Past Surgical History:  Procedure Laterality Date  . FRACTURE  SURGERY      Social History   Tobacco Use  . Smoking status: Never Smoker  . Smokeless tobacco: Never Used  Substance Use Topics  . Alcohol use: Not on file  . Drug use: Not on file    Family History  Problem Relation Age of Onset  . Cancer Mother     No Known Allergies  Medication list has been reviewed and updated.  Current Outpatient Medications on File Prior to Visit  Medication Sig Dispense Refill  . CALCIUM-MAGNESIUM-VITAMIN D PO Take 2 tablets by mouth daily.    . Cholecalciferol (VITAMIN D3) 5000 units TABS Take 1 tablet by mouth daily.    . diclofenac sodium (VOLTAREN) 1 % GEL Apply 2 g topically 4 (four) times daily. 1 Tube 0  . meloxicam (MOBIC) 7.5 MG tablet Take 1 tablet (7.5 mg total) by mouth daily. 15 tablet 0  . Multiple Vitamin (MULTIVITAMIN) tablet Take 3 tablets by mouth daily.    . Omega-3 Fatty Acids (THE VERY FINEST FISH OIL) LIQD Take by mouth.    . Potassium 99 MG TABS Take 1 tablet by mouth daily.    . Probiotic Product (ACIDOPHILUS/GOAT MILK) CAPS Take 2 capsules by mouth daily.    . saw palmetto  500 MG capsule Take 500 mg by mouth daily.    . vitamin C (ASCORBIC ACID) 500 MG tablet Take 500 mg by mouth daily.     No current facility-administered medications on file prior to visit.     Review of Systems: No cough or fever   Physical Examination: There were no vitals filed for this visit. There were no vitals filed for this visit. There is no height or weight on file to calculate BMI. Ideal Body Weight:    No physical exam today-we were not able to get WebCam to work Patient sounds well, no cough, wheezing, tachypnea is noted  Assessment and Plan: Sciatica of right side  Phone visit for sciatica today.  [place]urn feels comfortable using a course of prednisone, which we discussed in detail Summary of discussion with patient follows-  It was great to talk to you today- take care and I hope that your family remains well!  We are all missing  [place] so much!   We will treat you with prednisone for 10 days for your back- it sounds like you are having sciatica  Use the prednisone as directed, take with food if possible.  Please avoid taking it in the late evening hours Avoid taking NSAIDs like mobic, ibuprofen or aleve while you are on prednisone due to risk of stomach irritation -Tylenol however is okay  Please let me know if your back is not improved in the next 2 to 3 days, sooner if worse. If you develop any difficulty with bowel or bladder control, or groin numbness, you could have a more serious nerve compression.  If the symptoms occur please seek care in the emergency room-however I do not think this will be an issue  I spoke with patient for just over 10 minutes today Signed Abbe Amsterdam, MD

## 2018-10-26 NOTE — Telephone Encounter (Signed)
Copied from CRM #[id number]. Topic: Appointment Scheduling - Prior Auth Required for Appointment >> Oct 23, 2018 12:39 PM [name], Patrick Larsen wrote: No appointment has been scheduled. Patient is requesting sick appointment for nerve pain. Per scheduling protocol, this appointment requires a prior authorization prior to scheduling.  Route to department's PEC pool. 

## 2018-10-26 NOTE — Telephone Encounter (Signed)
Follow up call made to patient to discuss problems and possibly schedule appointment. Left message to return call.

## 2018-10-26 NOTE — Patient Instructions (Addendum)
It was great to talk to you today- take care and I hope that your family remains well!  We are all missing [place] so much!   We will treat you with prednisone for 10 days for your back- it sounds like you are having sciatica  Use the prednisone as directed, take with food if possible.  Please avoid taking it in the late evening hours Avoid taking NSAIDs like mobic, ibuprofen or aleve while you are on prednisone due to risk of stomach irritation -Tylenol however is okay  Please let me know if your back is not improved in the next 2 to 3 days, sooner if worse. If you develop any difficulty with bowel or bladder control, or groin numbness, you could have a more serious nerve compression.  If the symptoms occur please seek care in the emergency room-however I do not think this will be an issue

## 2018-10-26 NOTE — Telephone Encounter (Signed)
Follow up call to  patient prior to scheduling appointment. States he has had Sciatic nerve pain for the past 7-8 days off and on. States pain increased in the last 5-6 days. States he has  Had problems walking because of this. States his tailbone has tingling sensation on the R side with tingling in the R foot also. [facility] and was given Voltaren Gel which he feels made him have increased muscle cramping. States he has increased his fluids. Would like an alternative to Valtoren Gel. Patient would like appointment to discuss. Pleas advise of ok to schedule.

## 2018-10-26 NOTE — Telephone Encounter (Signed)
Scheduled for 10:40

## 2019-02-01 NOTE — Progress Notes (Addendum)
[facility] [place] [address] [place], [place] [address] [phone] [phone]  Date:  02/03/2019   Name:  Patrick Larsen   DOB:  Apr 01, 1966   MRN:  [id number]  PCP:  Pearline Cablesopland, Davie Claud C, MD    Chief Complaint: Annual Exam   History of Present Illness:  Patrick Larsen is a 53 y.o. 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   Daughter Harmony-her school will be starting back a week early this fall. She will be in 5th grade this fall  Due for fasting labs today, suggested Shingrix He would like to do at a later date Cologuard last year  He is overall feeling well He has not been sick at all during the pandemic  He does use local honey for allergies which does help reduce his sx   He is getting plenty of exercise and walking.  He is very active at his job No CP or SOB with exercise Fasting for 9 hours   His wife was dx with HPV - she is being followed by GYN.  They have been married for about 10 years.  They have talked about this, and there is no concern about infidelity We discussed this in detail today.  [name] has not noticed any symptoms himself such as warts or other skin lesions.  He is circumcised. We discussed doing the Gardasil vaccine series, at this point he declines.  He will monitor himself carefully for any symptoms and will let me know if any concerns Patient Active Problem List   Diagnosis Date Noted  . Injury of left foot 03/04/2016  . Chronic low back pain 08/28/2015    History reviewed. No pertinent past medical history.  Past Surgical History:  Procedure Laterality Date  . FRACTURE SURGERY      Social History   Tobacco Use   . Smoking status: Never Smoker  . Smokeless tobacco: Never Used  Substance Use Topics  . Alcohol use: Not on file  . Drug use: Not on file    Family History  Problem Relation Age of Onset  . Cancer Mother     No Known Allergies  Medication list has been reviewed and updated.  Current Outpatient Medications on File Prior to Visit  Medication Sig Dispense Refill  . CALCIUM-MAGNESIUM-VITAMIN D PO Take 2 tablets by mouth daily.    . Cholecalciferol (VITAMIN D3) 5000 units TABS Take 1 tablet by mouth daily.    . diclofenac sodium (VOLTAREN) 1 % GEL Apply 2 g topically 4 (four) times daily. 1 Tube 0  . meloxicam (MOBIC) 7.5 MG tablet Take 1 tablet (7.5 mg total) by mouth daily. 15 tablet 0  . Multiple Vitamin (MULTIVITAMIN) tablet Take 3 tablets by mouth daily.    . Omega-3 Fatty Acids (THE VERY FINEST FISH OIL) LIQD Take by mouth.    . Potassium 99 MG TABS Take 1 tablet by mouth daily.    . Probiotic Product (ACIDOPHILUS/GOAT MILK) CAPS Take 2 capsules by mouth daily.    . saw palmetto 500 MG capsule Take 500 mg by mouth daily.    . vitamin C (ASCORBIC  ACID) 500 MG tablet Take 500 mg by mouth daily.     No current facility-administered medications on file prior to visit.     Review of Systems:  As per HPI- otherwise negative. No fever or chills  Physical Examination: Vitals:   02/03/19 0906  BP: 128/80  Pulse: 72  Resp: 16  Temp: 98.6 F (37 C)  SpO2: 99%   Vitals:   02/03/19 0906  Weight: 201 lb (91.2 kg)  Height: 6\' 1"  (1.854 m)   Body mass index is 26.52 kg/m. Ideal Body Weight: Weight in (lb) to have BMI = 25: 189.1  GEN: WDWN, NAD, Non-toxic, A & O x 3, fit build, looks well  HEENT: Atraumatic, Normocephalic. Neck supple. No masses, No LAD. Ears and Nose: No external deformity. CV: RRR, No M/G/R. No JVD. No thrill. No extra heart sounds. PULM: CTA B, no wheezes, crackles, rhonchi. No retractions. No resp. distress. No accessory muscle use. ABD: S, NT,  ND, +BS. No rebound. No HSM. EXTR: No c/c/e NEURO Normal gait.  PSYCH: Normally interactive. Conversant. Not depressed or anxious appearing.  Calm demeanor.  He has noted a small skin lesion on the left forehead- he is not sure if this is new Appears to be a seborrheic keratosis, otherwise somewhat dark in color  Verbal consent obtained.  Cryotherapy with liquid nitrogen x3 to the skin lesion   Assessment and Plan:   ICD-10-CM   1. Physical exam  Z00.00   2. Screening for diabetes mellitus  Z13.1 Comprehensive metabolic panel    Hemoglobin A1c  3. Screening for hyperlipidemia  Z13.220 Lipid panel  4. Screening for prostate cancer  Z12.5 PSA  5. Screening for deficiency anemia  Z13.0 CBC  6. Screening for HIV (human immunodeficiency virus)  Z11.4 HIV Antibody (routine testing w rflx)   Here today for complete physical.  [name] is generally in good health, has no particular concerns today Labs pending as above- Will plan further follow- up pending labs. He will watch for any sign of HPV disease Discussed shingrix and gardasil   Follow-up: No follow-ups on file.  No orders of the defined types were placed in this encounter.  Orders Placed This Encounter  Procedures  . CBC  . Comprehensive metabolic panel  . Hemoglobin A1c  . Lipid panel  . HIV Antibody (routine testing w rflx)  . PSA    @SIGN @    Signed Lamar Blinks, MD  Received his labs as below, message to pt  Results for orders placed or performed in visit on 02/03/19  CBC  Result Value Ref Range   WBC 7.5 4.0 - 10.5 K/uL   RBC 4.47 4.22 - 5.81 Mil/uL   Platelets 298.0 150.0 - 400.0 K/uL   Hemoglobin 13.3 13.0 - 17.0 g/dL   HCT 40.7 39.0 - 52.0 %   MCV 91.1 78.0 - 100.0 fl   MCHC 32.8 30.0 - 36.0 g/dL   RDW 14.1 11.5 - 15.5 %  Comprehensive metabolic panel  Result Value Ref Range   Sodium 138 135 - 145 mEq/L   Potassium 5.4 No hemolysis seen (H) 3.5 - 5.1 mEq/L   Chloride 102 96 - 112 mEq/L   CO2 32  19 - 32 mEq/L   Glucose, Bld 73 70 - 99 mg/dL   BUN 11 6 - 23 mg/dL   Creatinine, Ser 1.42 0.40 - 1.50 mg/dL   Total Bilirubin 1.0 0.2 - 1.2 mg/dL   Alkaline Phosphatase 55 39 - 117 U/L  AST 33 0 - 37 U/L   ALT 34 0 - 53 U/L   Total Protein 7.0 6.0 - 8.3 g/dL   Albumin 4.3 3.5 - 5.2 g/dL   Calcium 86.510.7 (H) 8.4 - 10.5 mg/dL   GFR 78.4652.31 (L) >96.29>60.00 mL/min  Hemoglobin A1c  Result Value Ref Range   Hgb A1c MFr Bld 5.0 4.6 - 6.5 %  Lipid panel  Result Value Ref Range   Cholesterol 176 0 - 200 mg/dL   Triglycerides 528.4130.0 0.0 - 149.0 mg/dL   HDL 13.2419.00 (L) >40.10>39.00 mg/dL   VLDL 27.226.0 0.0 - 53.640.0 mg/dL   LDL Cholesterol 644131 (H) 0 - 99 mg/dL   Total CHOL/HDL Ratio 9    NonHDL 157.07   PSA  Result Value Ref Range   PSA 0.43 0.10 - 4.00 ng/mL    Lab Results  Component Value Date   PSA 0.43 02/03/2019   PSA 0.79 01/26/2018   PSA 0.75 01/08/2017   Pt contacted me on 7/29- I had thought that I sent a message but must not have,  Will followup with him now

## 2019-02-01 NOTE — Patient Instructions (Addendum)
It was great to see you today, I will be in touch with your labs ASAP Let me know if the little skin lesion on your forehead does not dry up and peel off as we expect- in that case I will refer you to derm Keep an eye out for any genital warts or other changes- let me know if these occur   Health Maintenance, Male Adopting a healthy lifestyle and getting preventive care are important in promoting health and wellness. Ask your health care provider about:  The right schedule for you to have regular tests and exams.  Things you can do on your own to prevent diseases and keep yourself healthy. What should I know about diet, weight, and exercise? Eat a healthy diet   Eat a diet that includes plenty of vegetables, fruits, low-fat dairy products, and lean protein.  Do not eat a lot of foods that are high in solid fats, added sugars, or sodium. Maintain a healthy weight Body mass index (BMI) is a measurement that can be used to identify possible weight problems. It estimates body fat based on height and weight. Your health care provider can help determine your BMI and help you achieve or maintain a healthy weight. Get regular exercise Get regular exercise. This is one of the most important things you can do for your health. Most adults should:  Exercise for at least 150 minutes each week. The exercise should increase your heart rate and make you sweat (moderate-intensity exercise).  Do strengthening exercises at least twice a week. This is in addition to the moderate-intensity exercise.  Spend less time sitting. Even light physical activity can be beneficial. Watch cholesterol and blood lipids Have your blood tested for lipids and cholesterol at 52 years of age, then have this test every 5 years. You may need to have your cholesterol levels checked more often if:  Your lipid or cholesterol levels are high.  You are older than 52 years of age.  You are at high risk for heart disease. What  should I know about cancer screening? Many types of cancers can be detected early and may often be prevented. Depending on your health history and family history, you may need to have cancer screening at various ages. This may include screening for:  Colorectal cancer.  Prostate cancer.  Skin cancer.  Lung cancer. What should I know about heart disease, diabetes, and high blood pressure? Blood pressure and heart disease  High blood pressure causes heart disease and increases the risk of stroke. This is more likely to develop in people who have high blood pressure readings, are of African descent, or are overweight.  Talk with your health care provider about your target blood pressure readings.  Have your blood pressure checked: ? Every 3-5 years if you are 50-54 years of age. ? Every year if you are 67 years old or older.  If you are between the ages of 51 and 65 and are a current or former smoker, ask your health care provider if you should have a one-time screening for abdominal aortic aneurysm (AAA). Diabetes Have regular diabetes screenings. This checks your fasting blood sugar level. Have the screening done:  Once every three years after age 52 if you are at a normal weight and have a low risk for diabetes.  More often and at a younger age if you are overweight or have a high risk for diabetes. What should I know about preventing infection? Hepatitis B If you have  a higher risk for hepatitis B, you should be screened for this virus. Talk with your health care provider to find out if you are at risk for hepatitis B infection. Hepatitis C Blood testing is recommended for:  Everyone born from [birth year] through 1964.  Anyone with known risk factors for hepatitis C. Sexually transmitted infections (STIs)  You should be screened each year for STIs, including gonorrhea and chlamydia, if: ? You are sexually active and are younger than 52 years of age. ? You are older than 52 years of  age and your health care provider tells you that you are at risk for this type of infection. ? Your sexual activity has changed since you were last screened, and you are at increased risk for chlamydia or gonorrhea. Ask your health care provider if you are at risk.  Ask your health care provider about whether you are at high risk for HIV. Your health care provider may recommend a prescription medicine to help prevent HIV infection. If you choose to take medicine to prevent HIV, you should first get tested for HIV. You should then be tested every 3 months for as long as you are taking the medicine. Follow these instructions at home: Lifestyle  Do not use any products that contain nicotine or tobacco, such as cigarettes, e-cigarettes, and chewing tobacco. If you need help quitting, ask your health care provider.  Do not use street drugs.  Do not share needles.  Ask your health care provider for help if you need support or information about quitting drugs. Alcohol use  Do not drink alcohol if your health care provider tells you not to drink.  If you drink alcohol: ? Limit how much you have to 0-2 drinks a day. ? Be aware of how much alcohol is in your drink. In the [place] one drink equals one 12 oz bottle of beer (355 mL), one 5 oz glass of wine (148 mL), or one 1 oz glass of hard liquor (44 mL). General instructions  Schedule regular health, dental, and eye exams.  Stay current with your vaccines.  Tell your health care provider if: ? You often feel depressed. ? You have ever been abused or do not feel safe at home. Summary  Adopting a healthy lifestyle and getting preventive care are important in promoting health and wellness.  Follow your health care provider's instructions about healthy diet, exercising, and getting tested or screened for diseases.  Follow your health care provider's instructions on monitoring your cholesterol and blood pressure. This information is not intended  to replace advice given to you by your health care provider. Make sure you discuss any questions you have with your health care provider. Document Released: 12/28/2007 Document Revised: 06/24/2018 Document Reviewed: 06/24/2018 Elsevier Patient Education  2020 [employer]

## 2019-02-03 ENCOUNTER — Encounter: Payer: Self-pay | Admitting: Family Medicine

## 2019-02-03 ENCOUNTER — Other Ambulatory Visit: Payer: Self-pay

## 2019-02-03 ENCOUNTER — Ambulatory Visit (INDEPENDENT_AMBULATORY_CARE_PROVIDER_SITE_OTHER): Payer: BC Managed Care – PPO | Admitting: Family Medicine

## 2019-02-03 VITALS — BP 128/80 | HR 72 | Temp 98.6°F | Resp 16 | Ht 73.0 in | Wt 201.0 lb

## 2019-02-03 DIAGNOSIS — Z130 Encounter for screening for diseases of the blood and blood-forming organs and certain disorders involving the immune mechanism: Secondary | ICD-10-CM

## 2019-02-03 DIAGNOSIS — Z114 Encounter for screening for human immunodeficiency virus [HIV]: Secondary | ICD-10-CM

## 2019-02-03 DIAGNOSIS — Z0000 Encounter for general adult medical examination without abnormal findings: Secondary | ICD-10-CM

## 2019-02-03 DIAGNOSIS — Z13220 Encounter for screening for lipoid disorders: Secondary | ICD-10-CM | POA: Diagnosis not present

## 2019-02-03 DIAGNOSIS — Z131 Encounter for screening for diabetes mellitus: Secondary | ICD-10-CM | POA: Diagnosis not present

## 2019-02-03 DIAGNOSIS — Z125 Encounter for screening for malignant neoplasm of prostate: Secondary | ICD-10-CM

## 2019-02-03 LAB — COMPREHENSIVE METABOLIC PANEL
ALT: 34 U/L (ref 0–53)
AST: 33 U/L (ref 0–37)
Albumin: 4.3 g/dL (ref 3.5–5.2)
Alkaline Phosphatase: 55 U/L (ref 39–117)
BUN: 11 mg/dL (ref 6–23)
CO2: 32 mEq/L (ref 19–32)
Calcium: 10.7 mg/dL — ABNORMAL HIGH (ref 8.4–10.5)
Chloride: 102 mEq/L (ref 96–112)
Creatinine, Ser: 1.42 mg/dL (ref 0.40–1.50)
GFR: 52.31 mL/min — ABNORMAL LOW (ref >60.00)
Glucose, Bld: 73 mg/dL (ref 70–99)
Potassium: 5.4 mEq/L — ABNORMAL HIGH (ref 3.5–5.1)
Sodium: 138 mEq/L (ref 135–145)
Total Bilirubin: 1 mg/dL (ref 0.2–1.2)
Total Protein: 7 g/dL (ref 6.0–8.3)

## 2019-02-03 LAB — LIPID PANEL
Cholesterol: 176 mg/dL (ref 0–200)
HDL: 19 mg/dL — ABNORMAL LOW (ref >39.00)
LDL Cholesterol: 131 mg/dL — ABNORMAL HIGH (ref 0–99)
NonHDL: 157.07
Total CHOL/HDL Ratio: 9
Triglycerides: 130 mg/dL (ref 0.0–149.0)
VLDL: 26 mg/dL (ref 0.0–40.0)

## 2019-02-03 LAB — CBC
HCT: 40.7 % (ref 39.0–52.0)
Hemoglobin: 13.3 g/dL (ref 13.0–17.0)
MCHC: 32.8 g/dL (ref 30.0–36.0)
MCV: 91.1 fl (ref 78.0–100.0)
Platelets: 298 10*3/uL (ref 150.0–400.0)
RBC: 4.47 Mil/uL (ref 4.22–5.81)
RDW: 14.1 % (ref 11.5–15.5)
WBC: 7.5 10*3/uL (ref 4.0–10.5)

## 2019-02-03 LAB — PSA: PSA: 0.43 ng/mL (ref 0.10–4.00)

## 2019-02-03 LAB — HEMOGLOBIN A1C: Hgb A1c MFr Bld: 5 % (ref 4.6–6.5)

## 2019-02-04 LAB — HIV ANTIBODY (ROUTINE TESTING W REFLEX): HIV 1&2 Ab, 4th Generation: NONREACTIVE

## 2019-02-10 ENCOUNTER — Encounter: Payer: Self-pay | Admitting: Family Medicine

## 2019-02-10 DIAGNOSIS — E875 Hyperkalemia: Secondary | ICD-10-CM

## 2019-02-12 NOTE — Telephone Encounter (Signed)
Lab appt scheduled.

## 2019-02-18 ENCOUNTER — Other Ambulatory Visit: Payer: Self-pay

## 2019-02-18 ENCOUNTER — Other Ambulatory Visit (INDEPENDENT_AMBULATORY_CARE_PROVIDER_SITE_OTHER): Payer: BC Managed Care – PPO

## 2019-02-18 DIAGNOSIS — E875 Hyperkalemia: Secondary | ICD-10-CM | POA: Diagnosis not present

## 2019-02-18 LAB — BASIC METABOLIC PANEL
BUN: 10 mg/dL (ref 6–23)
CO2: 30 mEq/L (ref 19–32)
Calcium: 9.5 mg/dL (ref 8.4–10.5)
Chloride: 104 mEq/L (ref 96–112)
Creatinine, Ser: 1.34 mg/dL (ref 0.40–1.50)
GFR: 55.92 mL/min — ABNORMAL LOW (ref >60.00)
Glucose, Bld: 87 mg/dL (ref 70–99)
Potassium: 4.1 mEq/L (ref 3.5–5.1)
Sodium: 139 mEq/L (ref 135–145)

## 2019-05-29 ENCOUNTER — Ambulatory Visit
Admission: EM | Admit: 2019-05-29 | Discharge: 2019-05-29 | Disposition: A | Discharge status: Home / Self Care | Payer: BC Managed Care – PPO | Attending: Physician Assistant | Admitting: Physician Assistant

## 2019-05-29 ENCOUNTER — Encounter: Payer: Self-pay | Admitting: Emergency Medicine

## 2019-05-29 ENCOUNTER — Other Ambulatory Visit: Payer: Self-pay

## 2019-05-29 DIAGNOSIS — L299 Pruritus, unspecified: Secondary | ICD-10-CM | POA: Diagnosis not present

## 2019-05-29 DIAGNOSIS — M7989 Other specified soft tissue disorders: Secondary | ICD-10-CM | POA: Diagnosis not present

## 2019-05-29 MED ORDER — PREDNISONE 50 MG PO TABS: 50.0000 mg | ORAL_TABLET | Freq: Every day | ORAL | 0 refills | Status: DC

## 2019-05-29 MED ORDER — PERMETHRIN 5 % EX CREA: TOPICAL_CREAM | CUTANEOUS | 0 refills | Status: DC

## 2019-05-29 NOTE — Discharge Instructions (Signed)
Start permethrin and prednisone as directed. Ice compress to the hand. Avoid new body hygiene products. Monitor for worsening symptoms, pain, warmth follow up for reevaluation needed.

## 2019-05-29 NOTE — ED Notes (Signed)
Patient able to ambulate independently  

## 2019-05-29 NOTE — ED Triage Notes (Signed)
Pt presents to [facility] for assessment of left neck pain intermittently x 3 days, relieved with tylenol or ibuprofen.  Patient states Friday night he began having itchiness to bilateral hands.  Patient states at 3am this morning he woke up and noted his hands were both swollen, there were color changes and rash to his wrists, and redness to his left AC.  Also c/o tenderness to left heel starting at that time.

## 2019-05-29 NOTE — ED Provider Notes (Signed)
[facility]    CSN: [id number] Arrival date & time: 05/29/19  0859      History   Chief Complaint Chief Complaint  Patient presents with  . Hand Pain    HPI Patrick Larsen is a 53 y.o. male.   53 year old male comes in for 3 day history of MSK complaint. Started with left neck pain intermittently 3 days ago. States sometimes pain makes him wonder if it is lymph node pain. Had been using ibuprofen/aspirin and warm compress with good relief. Yesterday, had some itching to the left lateral forearm with possible insect bite. Then started with itching to bilateral palms. And then noticed some left heel pain with walking. States woke up at night with worsening itching to the palms with some hand swelling with spreading to the flexor wrist. And therefore came in for evaluation. He has some rash to the wrist, but without any seen to the palms. Denies any pain, hand numbness/tingling. Has recently stayed in a hotel. Has had increase in hand washing due to COVID pandemic, but denies any new hygiene product use.  He is left-hand dominant.  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     History reviewed. No pertinent past medical history.  Patient Active Problem List   Diagnosis Date Noted  . Injury of left foot 03/04/2016  . Chronic low back pain 08/28/2015    Past Surgical History:  Procedure Laterality Date  . FRACTURE SURGERY         Home Medications    Prior to Admission medications   Medication Sig Start Date End Date Taking? Authorizing Provider  CALCIUM-MAGNESIUM-VITAMIN D PO Take 2 tablets by mouth daily.    [provider]  Cholecalciferol (VITAMIN D3) 5000 units TABS Take 1 tablet by mouth daily.    [provider]  Multiple Vitamin (MULTIVITAMIN) tablet Take 3 tablets by mouth daily.    [provider]  Omega-3 Fatty Acids (THE VERY FINEST FISH OIL) LIQD Take by mouth.    [provider]   permethrin (ELIMITE) 5 % cream Thoroughly massage cream from head to soles of feet; leave on for 8 to 14 hours before removing (shower or bath). Can repeat in 2 weeks if needed 05/29/19   Belinda Fisher, PA-C  Potassium 99 MG TABS Take 1 tablet by mouth daily.    [provider]  predniSONE (DELTASONE) 50 MG tablet Take 1 tablet (50 mg total) by mouth daily with breakfast. 05/29/19   Cathie Hoops, Ashli Selders V, PA-C  Probiotic Product (ACIDOPHILUS/GOAT MILK) CAPS Take 2 capsules by mouth daily.    [provider]  saw palmetto 500 MG capsule Take 500 mg by mouth daily.    [provider]  vitamin C (ASCORBIC ACID) 500 MG tablet Take 500 mg by mouth daily.    [provider]    Family History Family History  Problem Relation Age of Onset  . Cancer Mother     Social History Social History   Tobacco Use  . Smoking status: Never Smoker  . Smokeless tobacco: Never Used  Substance Use Topics  . Alcohol use: Not on file  . Drug use: Not on file     Allergies   Patient has no known allergies.   Review of Systems Review of Systems  Reason unable to perform ROS: See HPI as above.     Physical Exam Triage Vital Signs ED Triage Vitals  Enc Vitals Group  BP 05/29/19 0909 119/76     Pulse Rate 05/29/19 0909 70     Resp 05/29/19 0909 18     Temp 05/29/19 0909 98.1 F (36.7 C)     Temp Source 05/29/19 0909 Oral     SpO2 05/29/19 0909 95 %     Weight --      Height --      Head Circumference --      Peak Flow --      Pain Score 05/29/19 0917 0     Pain Loc --      Pain Edu? --      Excl. in [place]? --    No data found.  Updated Vital Signs BP 119/76 (BP Location: Left Arm)   Pulse 70   Temp 98.1 F (36.7 C) (Oral)   Resp 18   SpO2 95%   Physical Exam Constitutional:      General: He is not in acute distress.    Appearance: He is well-developed. He is not diaphoretic.  HENT:     Head: Normocephalic and atraumatic.  Eyes:     Conjunctiva/sclera:  Conjunctivae normal.     Pupils: Pupils are equal, round, and reactive to light.  Pulmonary:     Effort: Pulmonary effort is normal. No respiratory distress.  Musculoskeletal:     Comments: Swelling to the left hand.  Erythema to bilateral palms, patient itching during exam.  No streaking noted.  Maculopapular rash with slight hyperpigmentation to bilateral flexor wrist, no surrounding erythema.  No obvious contusion, warmth.   No tenderness to palpation of elbow, forearm, wrist, hands.  Full range of motion of elbow, wrist bilaterally.  Full range of motion of right fingers.  Decreased flexion to left fingers due to swelling.  Strength normal equal bilaterally.  Sensation intact and equal bilaterally.  Radial pulse 2+, cap refill less than 2 seconds.  Skin:    General: Skin is warm and dry.  Neurological:     Mental Status: He is alert and oriented to person, place, and time.      UC Treatments / Results  Labs (all labs ordered are listed, but only abnormal results are displayed) Labs Reviewed - No data to display  EKG   Radiology No results found.  Procedures Procedures (including critical care time)  Medications Ordered in UC Medications - No data to display  Initial Impression / Assessment and Plan / UC Course  I have reviewed the triage vital signs and the nursing notes.  Pertinent labs & imaging results that were available during my care of the patient were reviewed by me and considered in my medical decision making (see chart for details).    Discussed swelling most likely due to itching/irritation.  No signs of cellulitis on exam.  At this time, will start patient on permethrin to cover for scabies and prednisone to help with rash/itching.  Patient to avoid new hygiene product, and avoid itching.  Ice compress if needed for symptomatic relief.  Return precautions given.  Patient expresses understanding and agrees to plan.  Final Clinical Impressions(s) / UC  Diagnoses   Final diagnoses:  Swelling of left hand  Pruritus    ED Prescriptions    Medication Sig Dispense Auth. Provider   predniSONE (DELTASONE) 50 MG tablet Take 1 tablet (50 mg total) by mouth daily with breakfast. 5 tablet Ardena Gangl V, PA-C   permethrin (ELIMITE) 5 % cream Thoroughly massage cream from head to soles of feet; leave  on for 8 to 14 hours before removing (shower or bath). Can repeat in 2 weeks if needed 60 g Belinda FisherYu, Jabree Rebert V, PA-C     PDMP not reviewed this encounter.   Belinda FisherYu, Tasharra Nodine V, PA-C 05/29/19 1914

## 2019-08-22 NOTE — Progress Notes (Addendum)
[facility] [address], [address] [place], [place] [address] [phone]  Date:  08/23/2019   Name:  Patrick Larsen   DOB:  1965-07-29   MRN:  [id number]  PCP:  Pearline Cables, MD    Chief Complaint: Skin Tag Removal   History of Present Illness:  Patrick Larsen is a 54 y.o. very pleasant male patient who presents with the following:  Patrick Larsen is a generally healthy gentleman here today with concern of a skin issue As a take care of his wife and his daughter, Patrick Larsen. 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  He tends to shave his scalp completely when he does a haircut.  He is not sure how long this bump has been there, but noticed it about 2 weeks ago.  It did bleed a bit when he caught it with his razor It is otherwise not painful or bothersome  Complete labs done in July Can offer flu shot and repeat lipid  He was using some testosterone supplements previously, at which point his lipids were quite high.  He has stopped doing this, and we hope that his lipids will be improved We discussed the COVID-19 vaccine today He declines a flu shot  Patient Active Problem List   Diagnosis Date Noted  . Injury of left foot 03/04/2016  . Chronic low back pain 08/28/2015    History reviewed. No pertinent past medical history.  Past Surgical History:  Procedure Laterality Date  . FRACTURE SURGERY      Social History   Tobacco Use  . Smoking status: Never Smoker  . Smokeless tobacco: Never Used  Substance Use Topics  . Alcohol use: Not on file  . Drug use: Not on file    Family History  Problem Relation Age of Onset  . Cancer Mother     No Known Allergies  Medication list has been reviewed and updated.  Current Outpatient Medications on File Prior to Visit  Medication Sig  Dispense Refill  . CALCIUM-MAGNESIUM-VITAMIN D PO Take 2 tablets by mouth daily.    . Cholecalciferol (VITAMIN D3) 5000 units TABS Take 1 tablet by mouth daily.    . Multiple Vitamin (MULTIVITAMIN) tablet Take 3 tablets by mouth daily.    . Omega-3 Fatty Acids (THE VERY FINEST FISH OIL) LIQD Take by mouth.    . permethrin (ELIMITE) 5 % cream Thoroughly massage cream from head to soles of feet; leave on for 8 to 14 hours before removing (shower or bath). Can repeat in 2 weeks if needed 60 g 0  . Potassium 99 MG TABS Take 1 tablet by mouth daily.    . predniSONE (DELTASONE) 50 MG tablet Take 1 tablet (50 mg total) by mouth daily with breakfast. 5 tablet 0  . Probiotic Product (ACIDOPHILUS/GOAT MILK) CAPS Take 2 capsules by mouth daily.    . saw palmetto 500 MG capsule Take 500 mg by mouth daily.    . vitamin C (ASCORBIC ACID) 500 MG tablet Take 500 mg by mouth daily.     No current facility-administered medications on file prior to visit.    Review of Systems:  As per HPI- otherwise negative.   Physical Examination: Vitals:   08/23/19 1323  BP: 124/70  Pulse: 90  Resp: 16  Temp: 97.8 F (36.6 C)  SpO2: 100%   Vitals:   08/23/19 1323  Weight: 216 lb (98 kg)  Height: 6\' 1"  (1.854 m)   Body mass index is 28.5 kg/m. Ideal Body Weight: Weight in (lb) to have BMI = 25: 189.1  GEN: WDWN, NAD, Non-toxic, A & O x 3 HEENT: Atraumatic, Normocephalic. Neck supple. No masses, No LAD. Ears and Nose: No external deformity. CV: RRR, No M/G/R. No JVD. No thrill. No extra heart sounds. PULM: CTA B, no wheezes, crackles, rhonchi. No retractions. No resp. distress. No accessory muscle use. ABD: S, NT, ND, +BS. No rebound. No HSM. EXTR: No c/c/e NEURO Normal gait.  PSYCH: Normally interactive. Conversant. Not depressed or anxious appearing.  Calm demeanor.  There is an approximately 4 mm in diameter verrucous skin lesion at the back of the neck.  It has a stuck on appearance, does not appear  consistent with skin cancer  Verbal consent obtained.  Liquid nitrogen therapy x4 cycles to the skin lesion on back of neck.  Patient tolerated well, no immediate complications   Assessment and Plan: Screening for diabetes mellitus - Plan: Comprehensive metabolic panel, Hemoglobin A1c  Screening for hyperlipidemia - Plan: Lipid panel  Screening for deficiency anemia - Plan: CBC  Screening for prostate cancer - Plan: PSA  Verruca(e)  Here today to discuss a skin lesion on the back of his neck Await his labs and will be in touch with results Discussed Covid vaccine, encouraged him to have this done when he is able  Liquid nitrogen cryotherapy to skin lesion on back of neck.  He is asked to let me know if this does not dry up and peel off in the next 1 to 2 weeks, in this case we will plan to either shave in office or refer to dermatology  This visit occurred during the SARS-CoV-2 public health emergency.  Safety protocols were in place, including screening questions prior to the visit, additional usage of staff PPE, and extensive cleaning of exam room while observing appropriate contact time as indicated for disinfecting solutions.    Signed , MD  Received his labs 2/9- message to pt  Results for orders placed or performed in visit on 08/23/19  CBC  Result Value Ref Range   WBC 6.7 4.0 - 10.5 K/uL   RBC 4.91 4.22 - 5.81 Mil/uL   Platelets 227.0 150.0 - 400.0 K/uL   Hemoglobin 14.1 13.0 - 17.0 g/dL   HCT 10/21/19 70.6 - 23.7 %   MCV 88.1 78.0 - 100.0 fl   MCHC 32.7 30.0 - 36.0 g/dL   RDW 62.8 31.5 - 17.6 %  Comprehensive metabolic panel  Result Value Ref Range   Sodium 141 135 - 145 mEq/L   Potassium 4.4 3.5 - 5.1 mEq/L   Chloride 104 96 - 112 mEq/L   CO2 31 19 - 32 mEq/L   Glucose, Bld 72 70 - 99 mg/dL   BUN 19 6 - 23 mg/dL   Creatinine, Ser 16.0 (H) 0.40 - 1.50 mg/dL   Total Bilirubin 0.4 0.2 - 1.2 mg/dL   Alkaline Phosphatase 96 39 - 117 U/L   AST 26 0 -  37 U/L   ALT 27 0 - 53 U/L   Total Protein 7.0 6.0 - 8.3 g/dL   Albumin 4.4 3.5 - 5.2 g/dL   GFR 7.37 (L) 10.62 mL/min   Calcium 10.0 8.4 - 10.5 mg/dL  Hemoglobin >69.48  Result Value  Ref Range   Hgb A1c MFr Bld 5.8 4.6 - 6.5 %  Lipid panel  Result Value Ref Range   Cholesterol 175 0 - 200 mg/dL   Triglycerides 79.0 0.0 - 149.0 mg/dL   HDL 42.30 >39.00 mg/dL   VLDL 15.8 0.0 - 40.0 mg/dL   LDL Cholesterol 117 (H) 0 - 99 mg/dL   Total CHOL/HDL Ratio 4    NonHDL 132.51   PSA  Result Value Ref Range   PSA 0.66 0.10 - 4.00 ng/mL

## 2019-08-23 ENCOUNTER — Other Ambulatory Visit: Payer: Self-pay

## 2019-08-23 ENCOUNTER — Ambulatory Visit: Payer: BC Managed Care – PPO | Admitting: Family Medicine

## 2019-08-23 ENCOUNTER — Encounter: Payer: Self-pay | Admitting: Family Medicine

## 2019-08-23 VITALS — BP 124/70 | HR 90 | Temp 97.8°F | Resp 16 | Ht 73.0 in | Wt 216.0 lb

## 2019-08-23 DIAGNOSIS — R944 Abnormal results of kidney function studies: Secondary | ICD-10-CM

## 2019-08-23 DIAGNOSIS — Z131 Encounter for screening for diabetes mellitus: Secondary | ICD-10-CM

## 2019-08-23 DIAGNOSIS — Z125 Encounter for screening for malignant neoplasm of prostate: Secondary | ICD-10-CM | POA: Diagnosis not present

## 2019-08-23 DIAGNOSIS — Z13220 Encounter for screening for lipoid disorders: Secondary | ICD-10-CM | POA: Diagnosis not present

## 2019-08-23 DIAGNOSIS — B079 Viral wart, unspecified: Secondary | ICD-10-CM

## 2019-08-23 DIAGNOSIS — Z130 Encounter for screening for diseases of the blood and blood-forming organs and certain disorders involving the immune mechanism: Secondary | ICD-10-CM

## 2019-08-23 DIAGNOSIS — B078 Other viral warts: Secondary | ICD-10-CM

## 2019-08-23 LAB — COMPREHENSIVE METABOLIC PANEL
ALT: 27 U/L (ref 0–53)
AST: 26 U/L (ref 0–37)
Albumin: 4.4 g/dL (ref 3.5–5.2)
Alkaline Phosphatase: 96 U/L (ref 39–117)
BUN: 19 mg/dL (ref 6–23)
CO2: 31 mEq/L (ref 19–32)
Calcium: 10 mg/dL (ref 8.4–10.5)
Chloride: 104 mEq/L (ref 96–112)
Creatinine, Ser: 1.51 mg/dL — ABNORMAL HIGH (ref 0.40–1.50)
GFR: 48.62 mL/min — ABNORMAL LOW (ref >60.00)
Glucose, Bld: 72 mg/dL (ref 70–99)
Potassium: 4.4 mEq/L (ref 3.5–5.1)
Sodium: 141 mEq/L (ref 135–145)
Total Bilirubin: 0.4 mg/dL (ref 0.2–1.2)
Total Protein: 7 g/dL (ref 6.0–8.3)

## 2019-08-23 LAB — CBC
HCT: 43.2 % (ref 39.0–52.0)
Hemoglobin: 14.1 g/dL (ref 13.0–17.0)
MCHC: 32.7 g/dL (ref 30.0–36.0)
MCV: 88.1 fl (ref 78.0–100.0)
Platelets: 227 10*3/uL (ref 150.0–400.0)
RBC: 4.91 Mil/uL (ref 4.22–5.81)
RDW: 13.3 % (ref 11.5–15.5)
WBC: 6.7 10*3/uL (ref 4.0–10.5)

## 2019-08-23 LAB — LIPID PANEL
Cholesterol: 175 mg/dL (ref 0–200)
HDL: 42.3 mg/dL (ref >39.00)
LDL Cholesterol: 117 mg/dL — ABNORMAL HIGH (ref 0–99)
NonHDL: 132.51
Total CHOL/HDL Ratio: 4
Triglycerides: 79 mg/dL (ref 0.0–149.0)
VLDL: 15.8 mg/dL (ref 0.0–40.0)

## 2019-08-23 LAB — PSA: PSA: 0.66 ng/mL (ref 0.10–4.00)

## 2019-08-23 LAB — HEMOGLOBIN A1C: Hgb A1c MFr Bld: 5.8 % (ref 4.6–6.5)

## 2019-08-23 NOTE — Patient Instructions (Signed)
Great to see you again today- take care and I will be in touch with your labs asap We froze the area on the back of your neck today- please let me know if this does not dry and peel/ fall off in the next 1-2 weeks  Be safe!  Say hello to your family for me

## 2019-08-24 ENCOUNTER — Encounter: Payer: Self-pay | Admitting: Family Medicine

## 2019-08-24 NOTE — Addendum Note (Signed)
Addended by: Abbe Amsterdam C on: 08/24/2019 05:59 AM   Modules accepted: Orders

## 2019-08-26 ENCOUNTER — Telehealth: Payer: Self-pay | Admitting: Family Medicine

## 2019-08-26 NOTE — Telephone Encounter (Signed)
Pt has questions about his recent labs.  Pt did see results on Mychart but has more questions. Thanks

## 2019-08-26 NOTE — Telephone Encounter (Signed)
Called patient answered his questions regarding gfr. Advised him that she would like to recheck his GFR in 3 months. I have scheduled him for a follow up

## 2019-08-31 ENCOUNTER — Other Ambulatory Visit: Payer: Self-pay

## 2019-08-31 NOTE — Progress Notes (Signed)
[place] [facility] [address], [place], [place] [address] [phone]  Date:  09/01/2019   Name:  Patrick Larsen   DOB:  1966-03-29   MRN:  [id number]  PCP:  Darreld Mclean, MD    Chief Complaint: Shoulder Pain (yesterday-0830 left shoulder, near fall, possible workout related)   History of Present Illness:  Patrick Larsen is a 54 y.o. very pleasant male patient who presents with the following:  Here today to discuss recent shoulder injury and fall  We froze a lesion on the back of his head 10 days ago and it did seem to fall off spontaneously -successful treatment Patient is left-handed  He was exercising at work early Monday morning- today is Wednesday He was doing some shoulder and back work- he had a twinge in his left shoulder but did not seem like a big deal A little while later he slipped on a wet area outside and nearly fell. He caught himself on his left hand which jarred his left shoulder.  Since then he has had much more significant left shoulder pain, and difficulty using the shoulder  Yesterday he had a fair amount of pain, worse with flexion of the shoulder.  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 Injury of left foot 03/04/2016  . Chronic low back pain 08/28/2015    History reviewed. No pertinent past medical history.  Past Surgical History:  Procedure Laterality Date  . FRACTURE SURGERY      Social History   Tobacco Use  . Smoking status: Never Smoker  . Smokeless tobacco: Never Used  Substance Use Topics  . Alcohol use: Not on file  . Drug  use: Not on file    Family History  Problem Relation Age of Onset  . Cancer Mother     No Known Allergies  Medication list has been reviewed and updated.  Current Outpatient Medications on File Prior to Visit  Medication Sig Dispense Refill  . CALCIUM-MAGNESIUM-VITAMIN D PO Take 2 tablets by mouth daily.    . Cholecalciferol (VITAMIN D3) 5000 units TABS Take 1 tablet by mouth daily.    . Multiple Vitamin (MULTIVITAMIN) tablet Take 3 tablets by mouth daily.    . Omega-3 Fatty Acids (THE VERY FINEST FISH OIL) LIQD Take by mouth.    . permethrin (ELIMITE) 5 % cream Thoroughly massage cream from head to soles of feet; leave on for 8 to 14 hours before removing (shower or bath). Can repeat in 2 weeks if needed 60 g 0  . Potassium 99 MG TABS Take 1 tablet by mouth daily.    . predniSONE (DELTASONE) 50 MG tablet Take 1 tablet (50 mg total) by mouth daily with breakfast. 5 tablet 0  . Probiotic Product (ACIDOPHILUS/GOAT MILK) CAPS Take 2 capsules by mouth daily.    . saw palmetto 500 MG capsule Take 500 mg by mouth daily.    . vitamin C (ASCORBIC ACID) 500 MG tablet Take 500 mg by mouth daily.     No current facility-administered  medications on file prior to visit.    Review of Systems:  As per HPI- otherwise negative.   Physical Examination: Vitals:   09/01/19 1346  BP: 124/78  Pulse: 87  Resp: 16  Temp: 98.6 F (37 C)  SpO2: 98%   Vitals:   09/01/19 1346  Weight: 214 lb (97.1 kg)  Height: 6\' 1"  (1.854 m)   Body mass index is 28.23 kg/m. Ideal Body Weight: Weight in (lb) to have BMI = 25: 189.1  GEN: no acute distress.  Appears his normal self, muscular build HEENT: Atraumatic, Normocephalic.  Small skin lesion, presumed seborrheic keratosis, on the back of his scalp is nearly completely gone.  I did apply liquid nitrogen 3 more cycles today to ensure it is completely resolved Ears and Nose: No external deformity. CV: RRR, No M/G/R. No JVD. No thrill. No extra heart  sounds. PULM: CTA B, no wheezes, crackles, rhonchi. No retractions. No resp. distress. No accessory muscle use. EXTR: No c/c/e PSYCH: Normally interactive. Conversant.  The left biceps appears to have an abnormal curvature compared to the right, however the patient states this is his baseline, his left bicep muscle always has a somewhat different appearance He has poor range of motion of the left shoulder, can flex actively to about 80 degrees, abduct to about 60 degrees.  Active flexion and abduction to about 120 degrees.  Internal and external rotation both very painful, restricted to about 50% normal Shoulder is located Otherwise his shoulder is not especially tender to palpation.  There is no bruising or redness noted Normal strength of biceps, triceps, grip   Assessment and Plan: Strain of left shoulder, initial encounter - Plan: meloxicam (MOBIC) 7.5 MG tablet, traMADol (ULTRAM) 50 MG tablet, DG Shoulder Left, Ambulatory referral to Orthopedic Surgery  Acute renal insufficiency - Plan: CANCELED: Basic metabolic panel  Patient here today with left shoulder injury He irritated his shoulder exercising, and then fell a couple of hours later and injured it more.  I suspect he has an internal derangement of the shoulder some type Will obtain plain films today, refer to orthopedic surgery ASAP Prescribe meloxicam, tramadol to use as needed for more severe pain.  Advised gentle range of motion exercises to avoid frozen shoulder He is a , will need to be out of work or on administrative duty only until his shoulders better.  He is not able to handle a firearm or respond to emergency situation right now This visit occurred during the SARS-CoV-2 public health emergency.  Safety protocols were in place, including screening questions prior to the visit, additional usage of staff PPE, and extensive cleaning of exam room while observing appropriate contact time as indicated for disinfecting  solutions.    Signed Emergency planning/management officer, MD

## 2019-09-01 ENCOUNTER — Ambulatory Visit (HOSPITAL_BASED_OUTPATIENT_CLINIC_OR_DEPARTMENT_OTHER)
Admission: RE | Admit: 2019-09-01 | Discharge: 2019-09-01 | Disposition: A | Discharge status: Home / Self Care | Payer: BC Managed Care – PPO | Source: Ambulatory Visit | Attending: Family Medicine | Admitting: Family Medicine

## 2019-09-01 ENCOUNTER — Ambulatory Visit: Payer: BC Managed Care – PPO | Admitting: Family Medicine

## 2019-09-01 ENCOUNTER — Other Ambulatory Visit: Payer: Self-pay

## 2019-09-01 ENCOUNTER — Encounter: Payer: Self-pay | Admitting: Family Medicine

## 2019-09-01 VITALS — BP 124/78 | HR 87 | Temp 98.6°F | Resp 16 | Ht 73.0 in | Wt 214.0 lb

## 2019-09-01 DIAGNOSIS — N289 Disorder of kidney and ureter, unspecified: Secondary | ICD-10-CM | POA: Diagnosis not present

## 2019-09-01 DIAGNOSIS — S46912A Strain of unspecified muscle, fascia and tendon at shoulder and upper arm level, left arm, initial encounter: Secondary | ICD-10-CM | POA: Diagnosis not present

## 2019-09-01 MED ORDER — TRAMADOL HCL 50 MG PO TABS: 50.0000 mg | ORAL_TABLET | Freq: Three times a day (TID) | ORAL | 0 refills | Status: AC | PRN

## 2019-09-01 MED ORDER — MELOXICAM 7.5 MG PO TABS: 7.5000 mg | ORAL_TABLET | Freq: Every day | ORAL | 0 refills | Status: DC

## 2019-09-01 NOTE — Patient Instructions (Signed)
Good to see you today but I am so sorry you got hurt!    Please stop by the lab- hopefully if your kidney function is back to normal we can be done with that issue  For your shouder -x ray today -ortho referral for next week -ice, gentle range of motion.  You may wear a sling for a few hours a day to get a breat if if you like -try tylenol up to max dose on bottle  meloxicam 7.5 mg daily as needed Tramadol if needed for more severe pain- can cause sedation

## 2019-09-02 ENCOUNTER — Encounter: Payer: Self-pay | Admitting: Family Medicine

## 2019-09-08 ENCOUNTER — Ambulatory Visit: Payer: BC Managed Care – PPO

## 2019-11-24 ENCOUNTER — Other Ambulatory Visit: Payer: BC Managed Care – PPO

## 2020-02-05 NOTE — Progress Notes (Addendum)
[facility] [address], [address] [place], [place] [address] [phone]  Date:  02/07/2020   Name:  Patrick Larsen   DOB:  07-12-1966   MRN:  [id number]  PCP:  Pearline Cables, MD    Chief Complaint: Annual Exam   History of Present Illness:  Patrick Larsen is a 54 y.o. 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 He would like to do labs today  PSA ok 6 months ago, he would like to follow-up in a year so we will check PSA today  He is working out treadmill, elliptical 2-3x a week - no CP or SOB with cardio He is trying to maintain his fitness despite activity restriction due to shoulder injury  Married to [name], daughter [name]  Patient Active Problem List   Diagnosis Date Noted   Injury of left foot 03/04/2016   Chronic low back pain 08/28/2015    No past medical history on file.  Past Surgical History:  Procedure Laterality Date   FRACTURE SURGERY      Social History   Tobacco Use   Smoking status: Never Smoker   Smokeless tobacco: Never Used  Substance Use  Topics   Alcohol use: Not on file   Drug use: Not on file    Family History  Problem Relation Age of Onset   Cancer Mother     No Known Allergies  Medication list has been reviewed and updated.  Current Outpatient Medications on File Prior to Visit  Medication Sig Dispense Refill   CALCIUM-MAGNESIUM-VITAMIN D PO Take 2 tablets by mouth daily.     Cholecalciferol (VITAMIN D3) 5000 units TABS Take 1 tablet by mouth daily.     Multiple Vitamin (MULTIVITAMIN) tablet Take 3 tablets by mouth daily.     Omega-3 Fatty Acids (THE VERY FINEST FISH OIL) LIQD Take by mouth.     Potassium 99 MG TABS Take 1 tablet by mouth daily.     Probiotic Product (ACIDOPHILUS/GOAT MILK) CAPS Take 2 capsules by mouth daily.     saw palmetto 500 MG capsule Take 500 mg by mouth daily.     vitamin C (ASCORBIC ACID) 500 MG tablet Take 500 mg by mouth daily.     No current facility-administered medications on file prior to visit.    Review of Systems: As per HPI- otherwise negative.   Physical Examination: Vitals:   02/07/20 0903  BP: 118/80  Pulse: 71  Resp:  16  SpO2: 99%   Vitals:   02/07/20 0903  Weight: (!) 217 lb (98.4 kg)  Height: 6\' 1"  (1.854 m)   Body mass index is 28.63 kg/m. Ideal Body Weight: Weight in (lb) to have BMI = 25: 189.1  GEN: no acute distress.  Tall build, looks well  HEENT: Atraumatic, Normocephalic.   Bilateral TM wnl, oropharynx normal.  PEERL,EOMI.   Ears and Nose: No external deformity. CV: RRR, No M/G/R. No JVD. No thrill. No extra heart sounds. PULM: CTA B, no wheezes, crackles, rhonchi. No retractions. No resp. distress. No accessory muscle use. ABD: S, NT, ND, +BS. No rebound. No HSM. EXTR: No c/c/e PSYCH: Normally interactive. Conversant.  Patient does mention some left heel pain which is been present for 6 to 7 months.  Tends to occur when he first steps out of bed in the morning, or if he has been seated for a while and then gets up.  On exam he  has tenderness at the plantar fascia attachment at the calcaneus.  Otherwise foot exam is normal  Wt Readings from Last 3 Encounters:  02/07/20 (!) 217 lb (98.4 kg)  09/01/19 214 lb (97.1 kg)  08/23/19 216 lb (98 kg)    Assessment and Plan: Physical exam  Screening for diabetes mellitus - Plan: Comprehensive metabolic panel, Hemoglobin A1c  Screening for hyperlipidemia - Plan: Lipid panel  Screening for deficiency anemia - Plan: CBC  Encounter for hepatitis C screening test for low risk patient - Plan: Hepatitis C antibody  Screening for prostate cancer - Plan: PSA  Here today for routine physical exam.  In general [name] is doing very well, he did suffer a rotator cuff tear and is now status post repair.  This has changed his activity level somewhat which is a bit frustrating to him.  However he is making good progress Routine labs pending as above Covid vaccine is done Discussed Shingrix, he prefers to delay for now Encouraged continued exercise and healthy diet Cologuard up-to-date Will plan further follow- up pending labs. Plan for physical in 1 year This visit occurred during the SARS-CoV-2 public health emergency.  Safety protocols were in place, including screening questions prior to the visit, additional usage of staff PPE, and extensive cleaning of exam room while observing appropriate contact time as indicated for disinfecting solutions.    Signed Gregary Signs, MD  Received his labs as below, message to patient  Blood counts are normal Metabolic profile looks fine.  Minimal decrease in your GFR-kidney filtration rate-is stable and actually improved since last labs Your A1c-average blood sugar over the previous 3 months-continues to be barely in the prediabetes range.  We will continue to monitor Cholesterol looks good  PSA stable from years past, good news Lab Results  Component Value Date   PSA 0.66 02/07/2020   PSA 0.66 08/23/2019   PSA 0.43 02/03/2019    Generally great news, please see me in 1 year  JC Results for orders placed or performed in visit on 02/07/20  CBC  Result Value Ref Range   WBC 6.6 4.0 - 10.5 K/uL   RBC 4.68 4.22 - 5.81 Mil/uL   Platelets 240.0 150 - 400 K/uL   Hemoglobin 13.7 13.0 - 17.0 g/dL   HCT 02/09/20 39 - 52 %   MCV 87.3 78.0 - 100.0 fl   MCHC 33.5 30.0 - 36.0 g/dL   RDW 69.4 50.3 - 88.8 %  Comprehensive metabolic panel  Result Value Ref Range  Sodium 139 135 - 145 mEq/L   Potassium 4.3 3.5 - 5.1 mEq/L   Chloride 105 96 - 112 mEq/L   CO2 29 19 - 32 mEq/L   Glucose, Bld 97 70 - 99 mg/dL   BUN 14 6 - 23 mg/dL   Creatinine, Ser 8.18 0.40 - 1.50 mg/dL   Total Bilirubin 0.4 0.2 - 1.2 mg/dL   Alkaline Phosphatase 80 39 - 117 U/L   AST 19 0 - 37 U/L   ALT 18 0 - 53 U/L   Total Protein 6.7 6.0 - 8.3 g/dL   Albumin 4.3 3.5 - 5.2 g/dL   GFR 56.31 (L) >49.70 mL/min   Calcium 9.6 8.4 - 10.5 mg/dL  Hemoglobin Y6V  Result Value Ref Range   Hgb A1c MFr Bld 5.8 4.6 - 6.5 %  Lipid panel  Result Value Ref Range   Cholesterol 144 0 - 200 mg/dL   Triglycerides 78.5 0 - 149 mg/dL   HDL 88.50 >27.74 mg/dL   VLDL 12.8 0.0 - 78.6 mg/dL   LDL Cholesterol 90 0 - 99 mg/dL   Total CHOL/HDL Ratio 3    NonHDL 100.67   PSA  Result Value Ref Range   PSA 0.66 0.10 - 4.00 ng/mL

## 2020-02-05 NOTE — Patient Instructions (Signed)
Good to see you again today!  Take care and I will be in touch with your labs I would recommend the shingles vaccine at your convenience if not done already  Good luck with the rest of your rehab    Health Maintenance, Male Adopting a healthy lifestyle and getting preventive care are important in promoting health and wellness. Ask your health care provider about:  The right schedule for you to have regular tests and exams.  Things you can do on your own to prevent diseases and keep yourself healthy. What should I know about diet, weight, and exercise? Eat a healthy diet   Eat a diet that includes plenty of vegetables, fruits, low-fat dairy products, and lean protein.  Do not eat a lot of foods that are high in solid fats, added sugars, or sodium. Maintain a healthy weight Body mass index (BMI) is a measurement that can be used to identify possible weight problems. It estimates body fat based on height and weight. Your health care provider can help determine your BMI and help you achieve or maintain a healthy weight. Get regular exercise Get regular exercise. This is one of the most important things you can do for your health. Most adults should:  Exercise for at least 150 minutes each week. The exercise should increase your heart rate and make you sweat (moderate-intensity exercise).  Do strengthening exercises at least twice a week. This is in addition to the moderate-intensity exercise.  Spend less time sitting. Even light physical activity can be beneficial. Watch cholesterol and blood lipids Have your blood tested for lipids and cholesterol at 53 years of age, then have this test every 5 years. You may need to have your cholesterol levels checked more often if:  Your lipid or cholesterol levels are high.  You are older than 53 years of age.  You are at high risk for heart disease. What should I know about cancer screening? Many types of cancers can be detected early and may  often be prevented. Depending on your health history and family history, you may need to have cancer screening at various ages. This may include screening for:  Colorectal cancer.  Prostate cancer.  Skin cancer.  Lung cancer. What should I know about heart disease, diabetes, and high blood pressure? Blood pressure and heart disease  High blood pressure causes heart disease and increases the risk of stroke. This is more likely to develop in people who have high blood pressure readings, are of African descent, or are overweight.  Talk with your health care provider about your target blood pressure readings.  Have your blood pressure checked: ? Every 3-5 years if you are 92-91 years of age. ? Every year if you are 48 years old or older.  If you are between the ages of 43 and 97 and are a current or former smoker, ask your health care provider if you should have a one-time screening for abdominal aortic aneurysm (AAA). Diabetes Have regular diabetes screenings. This checks your fasting blood sugar level. Have the screening done:  Once every three years after age 83 if you are at a normal weight and have a low risk for diabetes.  More often and at a younger age if you are overweight or have a high risk for diabetes. What should I know about preventing infection? Hepatitis B If you have a higher risk for hepatitis B, you should be screened for this virus. Talk with your health care provider to find out  if you are at risk for hepatitis B infection. Hepatitis C Blood testing is recommended for:  Everyone born from [birth year] through 1964.  Anyone with known risk factors for hepatitis C. Sexually transmitted infections (STIs)  You should be screened each year for STIs, including gonorrhea and chlamydia, if: ? You are sexually active and are younger than 53 years of age. ? You are older than 53 years of age and your health care provider tells you that you are at risk for this type of  infection. ? Your sexual activity has changed since you were last screened, and you are at increased risk for chlamydia or gonorrhea. Ask your health care provider if you are at risk.  Ask your health care provider about whether you are at high risk for HIV. Your health care provider may recommend a prescription medicine to help prevent HIV infection. If you choose to take medicine to prevent HIV, you should first get tested for HIV. You should then be tested every 3 months for as long as you are taking the medicine. Follow these instructions at home: Lifestyle  Do not use any products that contain nicotine or tobacco, such as cigarettes, e-cigarettes, and chewing tobacco. If you need help quitting, ask your health care provider.  Do not use street drugs.  Do not share needles.  Ask your health care provider for help if you need support or information about quitting drugs. Alcohol use  Do not drink alcohol if your health care provider tells you not to drink.  If you drink alcohol: ? Limit how much you have to 0-2 drinks a day. ? Be aware of how much alcohol is in your drink. In the [place] one drink equals one 12 oz bottle of beer (355 mL), one 5 oz glass of wine (148 mL), or one 1 oz glass of hard liquor (44 mL). General instructions  Schedule regular health, dental, and eye exams.  Stay current with your vaccines.  Tell your health care provider if: ? You often feel depressed. ? You have ever been abused or do not feel safe at home. Summary  Adopting a healthy lifestyle and getting preventive care are important in promoting health and wellness.  Follow your health care provider's instructions about healthy diet, exercising, and getting tested or screened for diseases.  Follow your health care provider's instructions on monitoring your cholesterol and blood pressure. This information is not intended to replace advice given to you by your health care provider. Make sure you  discuss any questions you have with your health care provider. Document Revised: 06/24/2018 Document Reviewed: 06/24/2018 Elsevier Patient Education  2020 [employer].

## 2020-02-07 ENCOUNTER — Encounter: Payer: Self-pay | Admitting: Family Medicine

## 2020-02-07 ENCOUNTER — Ambulatory Visit (INDEPENDENT_AMBULATORY_CARE_PROVIDER_SITE_OTHER): Payer: BC Managed Care – PPO | Admitting: Family Medicine

## 2020-02-07 ENCOUNTER — Other Ambulatory Visit: Payer: Self-pay

## 2020-02-07 VITALS — BP 118/80 | HR 71 | Resp 16 | Ht 73.0 in | Wt 217.0 lb

## 2020-02-07 DIAGNOSIS — Z1159 Encounter for screening for other viral diseases: Secondary | ICD-10-CM

## 2020-02-07 DIAGNOSIS — Z130 Encounter for screening for diseases of the blood and blood-forming organs and certain disorders involving the immune mechanism: Secondary | ICD-10-CM

## 2020-02-07 DIAGNOSIS — Z131 Encounter for screening for diabetes mellitus: Secondary | ICD-10-CM | POA: Diagnosis not present

## 2020-02-07 DIAGNOSIS — Z125 Encounter for screening for malignant neoplasm of prostate: Secondary | ICD-10-CM

## 2020-02-07 DIAGNOSIS — Z0000 Encounter for general adult medical examination without abnormal findings: Secondary | ICD-10-CM

## 2020-02-07 DIAGNOSIS — Z13220 Encounter for screening for lipoid disorders: Secondary | ICD-10-CM

## 2020-02-07 LAB — LIPID PANEL
Cholesterol: 144 mg/dL (ref 0–200)
HDL: 43.8 mg/dL (ref >39.00)
LDL Cholesterol: 90 mg/dL (ref 0–99)
NonHDL: 100.67
Total CHOL/HDL Ratio: 3
Triglycerides: 51 mg/dL (ref 0.0–149.0)
VLDL: 10.2 mg/dL (ref 0.0–40.0)

## 2020-02-07 LAB — COMPREHENSIVE METABOLIC PANEL
ALT: 18 U/L (ref 0–53)
AST: 19 U/L (ref 0–37)
Albumin: 4.3 g/dL (ref 3.5–5.2)
Alkaline Phosphatase: 80 U/L (ref 39–117)
BUN: 14 mg/dL (ref 6–23)
CO2: 29 mEq/L (ref 19–32)
Calcium: 9.6 mg/dL (ref 8.4–10.5)
Chloride: 105 mEq/L (ref 96–112)
Creatinine, Ser: 1.38 mg/dL (ref 0.40–1.50)
GFR: 53.85 mL/min — ABNORMAL LOW (ref >60.00)
Glucose, Bld: 97 mg/dL (ref 70–99)
Potassium: 4.3 mEq/L (ref 3.5–5.1)
Sodium: 139 mEq/L (ref 135–145)
Total Bilirubin: 0.4 mg/dL (ref 0.2–1.2)
Total Protein: 6.7 g/dL (ref 6.0–8.3)

## 2020-02-07 LAB — CBC
HCT: 40.8 % (ref 39.0–52.0)
Hemoglobin: 13.7 g/dL (ref 13.0–17.0)
MCHC: 33.5 g/dL (ref 30.0–36.0)
MCV: 87.3 fl (ref 78.0–100.0)
Platelets: 240 10*3/uL (ref 150.0–400.0)
RBC: 4.68 Mil/uL (ref 4.22–5.81)
RDW: 12.8 % (ref 11.5–15.5)
WBC: 6.6 10*3/uL (ref 4.0–10.5)

## 2020-02-07 LAB — PSA: PSA: 0.66 ng/mL (ref 0.10–4.00)

## 2020-02-07 LAB — HEMOGLOBIN A1C: Hgb A1c MFr Bld: 5.8 % (ref 4.6–6.5)

## 2020-02-08 LAB — HEPATITIS C ANTIBODY
Hepatitis C Ab: NONREACTIVE
SIGNAL TO CUT-OFF: 0.1 (ref <1.00)

## 2020-05-02 DIAGNOSIS — R7303 Prediabetes: Secondary | ICD-10-CM | POA: Insufficient documentation

## 2020-05-02 NOTE — Progress Notes (Signed)
[facility] at [facility] [address], [address] [place], [place] [address] [phone]  Date:  05/04/2020   Name:  Patrick Larsen   DOB:  March 18, 1966   MRN:  [id number]  PCP:  Pearline Cables, MD    Chief Complaint: Thumb Pain (left thumb pain, 6 months, off and on, thumb locks up)   History of Present Illness:  Patrick Larsen is a 54 y.o. very pleasant male patient who presents with the following:  Generally healthy gentleman here today with concern of thumb pain Last seen by myself for physical exam in July He had a LEFT rotator cuff repair in May of this year, per Dr. Devonne Doughty He is LEFT handed  He is doing PT on his own, he is making progress with his strength and mobility.  Pain is much better We hope he will return to full duty in January He is married to [name], his daughter [name] is in 6 grade  Flu vaccine- pt declines today  Recent lab work showed A1c stable in prediabetes range  For 6-7 months he has noted an issue with his left thumb-  He was having off and on pain.  Would flare up and then resolve.   He is getting some locking up and then popping of the IP joint.  He can passively move the joint easily but active use will cause pain and locking  He is also interested in getting his T levels checked.  He knows that replacement therapy may not be a good idea, but he would like to see where he is.  He notes more difficulty in the gym and in losing weight as he gets older  He is not using any exogenous steroids per his report  Patient Active Problem List   Diagnosis Date Noted  . Prediabetes 05/02/2020  . Injury of left foot 03/04/2016  . Chronic low back pain 08/28/2015    History reviewed. No pertinent past medical history.  Past Surgical History:  Procedure Laterality Date  . FRACTURE SURGERY      Social History   Tobacco Use  . Smoking status: Never Smoker  . Smokeless tobacco: Never Used  Substance Use  Topics  . Alcohol use: Not on file  . Drug use: Not on file    Family History  Problem Relation Age of Onset  . Cancer Mother     No Known Allergies  Medication list has been reviewed and updated.  Current Outpatient Medications on File Prior to Visit  Medication Sig Dispense Refill  . CALCIUM-MAGNESIUM-VITAMIN D PO Take 2 tablets by mouth daily.    . Cholecalciferol (VITAMIN D3) 5000 units TABS Take 1 tablet by mouth daily.    . Multiple Vitamin (MULTIVITAMIN) tablet Take 3 tablets by mouth daily.    . Omega-3 Fatty Acids (THE VERY FINEST FISH OIL) LIQD Take by mouth.    . Potassium 99 MG TABS Take 1 tablet by mouth daily.    . Probiotic Product (ACIDOPHILUS/GOAT MILK) CAPS Take 2 capsules by mouth daily.    . saw palmetto 500 MG capsule Take 500 mg by mouth daily.    . vitamin C (ASCORBIC ACID) 500 MG tablet Take 500 mg by mouth daily.     No current facility-administered medications on file prior to visit.    Review of Systems:  As per HPI- otherwise negative.   Physical Examination: Vitals:   05/04/20 0851  BP: 122/80  Pulse: 72  Resp: 17  SpO2: 98%   Vitals:   05/04/20 0851  Weight: 210 lb (95.3 kg)  Height: 6\' 1"  (1.854 m)   Body mass index is 27.71 kg/m. Ideal Body Weight: Weight in (lb) to have BMI = 25: 189.1  GEN: no acute distress.  Looks well, fit build HEENT: Atraumatic, Normocephalic.  Ears and Nose: No external deformity. CV: RRR, No M/G/R. No JVD. No thrill. No extra heart sounds. PULM: CTA B, no wheezes, crackles, rhonchi. No retractions. No resp. distress. No accessory muscle use. EXTR: No c/c/e PSYCH: Normally interactive. Conversant.  Left thumb IP joint- he has some catching at the flexor surface and pain suggesting of trigger finger Grip strength is decreased    Assessment and Plan: Other fatigue - Plan: Testosterone Total,Free,Bio, Males-([facility])  Prediabetes  Trigger finger of left thumb  Will contact his ortho about his  thumb Recnet A1c ok Declines flu shot Will check T levels today- gave pt an AA[employer] article about R/B of T replacement for him to look over.  Will plan further follow- up pending labs.  This visit occurred during the SARS-CoV-2 public health emergency.  Safety protocols were in place, including screening questions prior to the visit, additional usage of staff PPE, and extensive cleaning of exam room while observing appropriate contact time as indicated for disinfecting solutions.    Signed , MD

## 2020-05-02 NOTE — Patient Instructions (Addendum)
Great to see you again today!  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

## 2020-05-04 ENCOUNTER — Encounter: Payer: Self-pay | Admitting: Family Medicine

## 2020-05-04 ENCOUNTER — Ambulatory Visit: Payer: BC Managed Care – PPO | Admitting: Family Medicine

## 2020-05-04 ENCOUNTER — Other Ambulatory Visit: Payer: Self-pay

## 2020-05-04 VITALS — BP 122/80 | HR 72 | Resp 17 | Ht 73.0 in | Wt 210.0 lb

## 2020-05-04 DIAGNOSIS — R7303 Prediabetes: Secondary | ICD-10-CM

## 2020-05-04 DIAGNOSIS — M65312 Trigger thumb, left thumb: Secondary | ICD-10-CM | POA: Diagnosis not present

## 2020-05-04 DIAGNOSIS — R5383 Other fatigue: Secondary | ICD-10-CM | POA: Diagnosis not present

## 2020-05-05 ENCOUNTER — Encounter: Payer: Self-pay | Admitting: Family Medicine

## 2020-05-05 DIAGNOSIS — E291 Testicular hypofunction: Secondary | ICD-10-CM

## 2020-05-05 LAB — TESTOSTERONE TOTAL,FREE,BIO, MALES
Albumin: 4.4 g/dL (ref 3.6–5.1)
Sex Hormone Binding: 11 nmol/L (ref 10–50)
Testosterone: 113 ng/dL — ABNORMAL LOW (ref 250–827)

## 2020-05-09 ENCOUNTER — Encounter: Payer: Self-pay | Admitting: Family Medicine

## 2020-05-09 DIAGNOSIS — M65312 Trigger thumb, left thumb: Secondary | ICD-10-CM

## 2020-05-10 ENCOUNTER — Other Ambulatory Visit: Payer: Self-pay

## 2020-05-10 ENCOUNTER — Other Ambulatory Visit (INDEPENDENT_AMBULATORY_CARE_PROVIDER_SITE_OTHER): Payer: BC Managed Care – PPO

## 2020-05-10 DIAGNOSIS — E291 Testicular hypofunction: Secondary | ICD-10-CM

## 2020-05-11 ENCOUNTER — Encounter: Payer: Self-pay | Admitting: Family Medicine

## 2020-05-11 DIAGNOSIS — E291 Testicular hypofunction: Secondary | ICD-10-CM

## 2020-05-11 LAB — TESTOSTERONE TOTAL,FREE,BIO, MALES
Albumin: 4.3 g/dL (ref 3.6–5.1)
Sex Hormone Binding: 12 nmol/L (ref 10–50)
Testosterone: 214 ng/dL — ABNORMAL LOW (ref 250–827)

## 2020-05-13 ENCOUNTER — Other Ambulatory Visit: Payer: Self-pay | Admitting: Family Medicine

## 2020-05-13 DIAGNOSIS — E291 Testicular hypofunction: Secondary | ICD-10-CM | POA: Insufficient documentation

## 2020-05-16 MED ORDER — TESTOSTERONE 20.25 MG/1.25GM (1.62%) TD GEL: TRANSDERMAL | 2 refills | Status: DC

## 2020-05-17 ENCOUNTER — Encounter: Payer: Self-pay | Admitting: Family Medicine

## 2020-05-18 MED ORDER — TESTOSTERONE 20.25 MG/1.25GM (1.62%) TD GEL: TRANSDERMAL | 2 refills | Status: DC

## 2020-05-18 NOTE — Addendum Note (Signed)
Addended by: Abbe Amsterdam C on: 05/18/2020 05:01 PM   Modules accepted: Orders

## 2020-05-22 ENCOUNTER — Telehealth: Payer: Self-pay

## 2020-05-22 ENCOUNTER — Encounter: Payer: Self-pay | Admitting: Family Medicine

## 2020-05-22 MED ORDER — TESTOSTERONE CYPIONATE 200 MG/ML IJ SOLN: INTRAMUSCULAR | 1 refills | Status: DC

## 2020-05-22 NOTE — Telephone Encounter (Signed)
PA initiated via Covermymeds;KEY: [id number]. Awaiting determination.

## 2020-05-22 NOTE — Telephone Encounter (Signed)
PA denied. Plan does not cover drug when for age related hypogonadism.

## 2020-05-22 NOTE — Addendum Note (Signed)
Addended by: Abbe Amsterdam C on: 05/22/2020 01:07 PM   Modules accepted: Orders

## 2020-05-22 NOTE — Addendum Note (Signed)
Addended by: Abbe Amsterdam C on: 05/22/2020 01:56 PM   Modules accepted: Orders

## 2020-05-31 NOTE — Progress Notes (Signed)
[facility] at [facility] [address], [address] [place], [place] [address] [phone]  Date:  06/01/2020   Name:  Patrick Larsen   DOB:  1965/12/07   MRN:  [id number]  PCP:  Pearline Cables, MD    Chief Complaint: Teaching for Testosterone Injection   History of Present Illness:  Patrick Larsen is a 54 y.o. very pleasant male patient who presents with the following:  Patient is here today for testosterone injection teaching- nurse visit  He noted symptoms suspicious for low testosterone and requested testosterone level  Total testosterone on October 21 was 113 Repeated October 27, total testosterone 214  We discussed risks and benefits, the patient is decided to move forward with injectable testosterone therapy.  His insurance does not cover topical testosterone  He plans to administer the injections at home and is here for teaching session We had planned to give 200 mg every 2 weeks but pt prefers to give 100 mg weekly which is also ok He feels comfortable giving these injections at home Patient Active Problem List   Diagnosis Date Noted  . Hypogonadism in male 05/13/2020  . Prediabetes 05/02/2020  . Injury of left foot 03/04/2016  . Chronic low back pain 08/28/2015    No past medical history on file.  Past Surgical History:  Procedure Laterality Date  . FRACTURE SURGERY      Social History   Tobacco Use  . Smoking status: Never Smoker  . Smokeless tobacco: Never Used  Substance Use Topics  . Alcohol use: Not on file  . Drug use: Not on file    Family History  Problem Relation Age of Onset  . Cancer Mother     No Known Allergies  Medication list has been reviewed and updated.  Current Outpatient Medications on File Prior to Visit  Medication Sig Dispense Refill  . CALCIUM-MAGNESIUM-VITAMIN D PO Take 2 tablets by mouth daily.    . Cholecalciferol (VITAMIN D3) 5000 units TABS Take 1 tablet by mouth daily.     . Multiple Vitamin (MULTIVITAMIN) tablet Take 3 tablets by mouth daily.    . Omega-3 Fatty Acids (THE VERY FINEST FISH OIL) LIQD Take by mouth.    . Potassium 99 MG TABS Take 1 tablet by mouth daily.    . Probiotic Product (ACIDOPHILUS/GOAT MILK) CAPS Take 2 capsules by mouth daily.    . saw palmetto 500 MG capsule Take 500 mg by mouth daily.    . Testosterone Cypionate 200 MG/ML SOLN Inject 200 mg every 2 weeks, adjust as recommended 5 mL 1  . vitamin C (ASCORBIC ACID) 500 MG tablet Take 500 mg by mouth daily.     No current facility-administered medications on file prior to visit.    Review of Systems:  As per HPI- otherwise negative.   Physical Examination: There were no vitals filed for this visit. There were no vitals filed for this visit. There is no height or weight on file to calculate BMI. Ideal Body Weight:     No exam today  Assessment and Plan: Testosterone deficiency - Plan: testosterone cypionate (DEPOTESTOSTERONE CYPIONATE) injection 100 mg, Testosterone Total,Free,Bio, Males-([facility])  Patient today for testosterone injection teaching.  This was done at nurse visit Assuming all is going well, asked patient to schedule a lab visit in about 1 month for testosterone level Ideally get labs midway between 2 injections Signed Abbe Amsterdam, MD

## 2020-06-01 ENCOUNTER — Ambulatory Visit: Payer: BC Managed Care – PPO | Admitting: Family Medicine

## 2020-06-01 ENCOUNTER — Other Ambulatory Visit: Payer: Self-pay

## 2020-06-01 ENCOUNTER — Encounter: Payer: Self-pay | Admitting: Family Medicine

## 2020-06-01 DIAGNOSIS — E349 Endocrine disorder, unspecified: Secondary | ICD-10-CM

## 2020-06-01 MED ORDER — "BD ECLIPSE SYRINGE 22G X 1"" 3 ML MISC": 3 refills | Status: DC

## 2020-06-01 MED ORDER — TESTOSTERONE CYPIONATE 200 MG/ML IM SOLN
100.0000 mg | Freq: Once | INTRAMUSCULAR | Status: AC
  Administered 2020-06-01: 100 mg via INTRAMUSCULAR

## 2020-06-01 NOTE — Progress Notes (Signed)
Patient here today for teaching of self administered testosterone injection. Patient supplied medication. He is to be taking 100 mg once a week. Vial contained 200mg /1mL. I drew up 0.19mL/100 mg and patient requested to self administer in right deltoid IM.

## 2020-07-17 ENCOUNTER — Encounter: Payer: Self-pay | Admitting: Family Medicine

## 2020-07-17 DIAGNOSIS — E291 Testicular hypofunction: Secondary | ICD-10-CM

## 2020-07-19 MED ORDER — TESTOSTERONE CYPIONATE 200 MG/ML IJ SOLN: INTRAMUSCULAR | 1 refills | Status: DC

## 2020-09-03 NOTE — Patient Instructions (Addendum)
Good to see you again today- I will be in touch with your labs asap We will see how your T levels look  See you this summer!   Be safe

## 2020-09-03 NOTE — Progress Notes (Addendum)
[facility] [address], [address] [place], [place] [address] [phone]  Date:  09/06/2020   Name:  Patrick Larsen   DOB:  11/25/1965   MRN:  [id number]  PCP:  Pearline Cables, MD    Chief Complaint: Blood Work (Testosterone/)   History of Present Illness:  Patrick Larsen is a 55 y.o. 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 Hypogonadism in male 05/13/2020  . Prediabetes 05/02/2020  . Injury of left foot 03/04/2016  . Chronic low back pain 08/28/2015    No past medical history on file.  Past Surgical History:  Procedure Laterality Date  . FRACTURE SURGERY      Social History   Tobacco Use  . Smoking status: Never Smoker  . Smokeless tobacco: Never Used    Family History  Problem Relation Age of Onset  . Cancer Mother     No Known Allergies  Medication list has been reviewed and updated.  Current Outpatient Medications on File Prior to Visit  Medication Sig Dispense Refill  . CALCIUM-MAGNESIUM-VITAMIN D PO Take 2 tablets by mouth daily.    . Cholecalciferol (VITAMIN D3) 5000 units TABS Take 1 tablet by mouth daily.    . Multiple Vitamin (MULTIVITAMIN) tablet Take 3 tablets by mouth daily.     . Omega-3 Fatty Acids (THE VERY FINEST FISH OIL) LIQD Take by mouth.    . Potassium 99 MG TABS Take 1 tablet by mouth daily.    . Probiotic Product (ACIDOPHILUS/GOAT MILK) CAPS Take 2 capsules by mouth daily.    . saw palmetto 500 MG capsule Take 500 mg by mouth daily.    . SYRINGE-NEEDLE, DISP, 3 ML (BD ECLIPSE SYRINGE) 22G X 1" 3 ML MISC Use as directed to administer Testosterone injection. E34.9 DX code 100 each 3  . Testosterone Cypionate 200 MG/ML SOLN Inject 200 mg every 2 weeks, adjust as recommended 5 mL 1  . vitamin C (ASCORBIC ACID) 500 MG tablet Take 500 mg by mouth daily.     No current facility-administered medications on file prior to visit.    Review of Systems:  As per HPI- otherwise negative.   Physical Examination: Vitals:   09/06/20 1410  BP: 122/80  Pulse: 74  Resp: 17  SpO2: 97%   Vitals:   09/06/20 1410  Weight: 215 lb (97.5 kg)  Height: 6\' 1"  (1.854 m)   Body mass index is 28.37 kg/m. Ideal Body Weight: Weight in (lb) to have BMI = 25: 189.1  GEN: no acute distress.  Looks well, fit build HEENT: Atraumatic, Normocephalic.  Ears and Nose: No external deformity. CV: RRR,  No M/G/R. No JVD. No thrill. No extra heart sounds. PULM: CTA B, no wheezes, crackles, rhonchi. No retractions. No resp. distress. No accessory muscle use. EXTR: No c/c/e PSYCH: Normally interactive. Conversant.    Assessment and Plan: Hypogonadism in male - Plan: Testosterone Total,Free,Bio, Males-([facility]), CBC, Lipid panel, PSA  Prediabetes - Plan: Hemoglobin A1c  Screening for hyperlipidemia - Plan: Lipid panel  Screening for deficiency anemia - Plan: CBC  Decreased GFR - Plan: Basic metabolic panel  Patient today for follow-up of hypogonadism treatment.  He is using testosterone injections with success.  We will check lab work as above to monitor for any side effects and check testosterone level He plans to see me in the summer for next visit This visit occurred during  the SARS-CoV-2 public health emergency.  Safety protocols were in place, including screening questions prior to the visit, additional usage of staff PPE, and extensive cleaning of exam room while observing appropriate contact time as indicated for disinfecting solutions.    Signed Abbe Amsterdam, MD  Received his labs as below 2/24- message to pt  Results for orders placed or performed in visit on 09/06/20  Testosterone Total,Free,Bio, Males-([facility])  Result Value Ref Range   Testosterone 503 250 - 827 ng/dL   Albumin 4.0 3.6 - 5.1 g/dL   Sex Hormone Binding 11 10 - 50 nmol/L   Testosterone, Free 153.6 46.0 - 224.0 pg/mL   Testosterone, Bioavailable 282.5 110.0 - 575 ng/dL  CBC  Result Value Ref Range   WBC 7.4 4.0 - 10.5 K/uL   RBC 5.09 4.22 - 5.81 Mil/uL   Platelets 214.0 150.0 - 400.0 K/uL   Hemoglobin 14.7 13.0 - 17.0 g/dL   HCT 87.6 81.1 - 57.2 %   MCV 88.3 78.0 - 100.0 fl   MCHC 32.8 30.0 - 36.0 g/dL   RDW 62.0 35.5 - 97.4 %  Basic metabolic panel  Result Value Ref Range   Sodium 140 135 - 145 mEq/L   Potassium 4.3 3.5 - 5.1 mEq/L   Chloride 102 96 - 112 mEq/L   CO2 30 19 - 32 mEq/L   Glucose, Bld 79 70 - 99 mg/dL   BUN 9 6 - 23 mg/dL   Creatinine, Ser 1.63 0.40 - 1.50 mg/dL   GFR 84.53 >64.68 mL/min   Calcium 9.5 8.4 - 10.5 mg/dL  Lipid panel  Result Value Ref Range   Cholesterol 111 0 - 200 mg/dL   Triglycerides 032.1 0.0 - 149.0 mg/dL   HDL 22.48 (L) >25.00 mg/dL   VLDL 37.0 0.0 - 48.8 mg/dL   LDL Cholesterol 67 0 - 99 mg/dL   Total CHOL/HDL Ratio 5    NonHDL 87.17   PSA  Result Value Ref Range   PSA 0.90 0.10 - 4.00 ng/mL  Hemoglobin A1c  Result Value Ref Range   Hgb A1c MFr Bld 5.3 4.6 - 6.5 %

## 2020-09-06 ENCOUNTER — Ambulatory Visit (INDEPENDENT_AMBULATORY_CARE_PROVIDER_SITE_OTHER): Payer: No Typology Code available for payment source | Admitting: Family Medicine

## 2020-09-06 ENCOUNTER — Encounter: Payer: Self-pay | Admitting: Family Medicine

## 2020-09-06 ENCOUNTER — Other Ambulatory Visit: Payer: Self-pay

## 2020-09-06 VITALS — BP 122/80 | HR 74 | Resp 17 | Ht 73.0 in | Wt 215.0 lb

## 2020-09-06 DIAGNOSIS — Z130 Encounter for screening for diseases of the blood and blood-forming organs and certain disorders involving the immune mechanism: Secondary | ICD-10-CM

## 2020-09-06 DIAGNOSIS — Z13220 Encounter for screening for lipoid disorders: Secondary | ICD-10-CM | POA: Diagnosis not present

## 2020-09-06 DIAGNOSIS — R944 Abnormal results of kidney function studies: Secondary | ICD-10-CM

## 2020-09-06 DIAGNOSIS — R7303 Prediabetes: Secondary | ICD-10-CM | POA: Diagnosis not present

## 2020-09-06 DIAGNOSIS — E291 Testicular hypofunction: Secondary | ICD-10-CM

## 2020-09-06 LAB — TESTOSTERONE TOTAL,FREE,BIO, MALES
Albumin: 4 g/dL (ref 3.6–5.1)
Sex Hormone Binding: 11 nmol/L (ref 10–50)
Testosterone, Bioavailable: 282.5 ng/dL (ref >=110.0)
Testosterone, Free: 153.6 pg/mL (ref 46.0–224.0)
Testosterone: 503 ng/dL (ref 250–827)

## 2020-09-07 ENCOUNTER — Encounter: Payer: Self-pay | Admitting: Family Medicine

## 2020-09-07 LAB — CBC
HCT: 45 % (ref 39.0–52.0)
Hemoglobin: 14.7 g/dL (ref 13.0–17.0)
MCHC: 32.8 g/dL (ref 30.0–36.0)
MCV: 88.3 fl (ref 78.0–100.0)
Platelets: 214 10*3/uL (ref 150.0–400.0)
RBC: 5.09 Mil/uL (ref 4.22–5.81)
RDW: 13.8 % (ref 11.5–15.5)
WBC: 7.4 10*3/uL (ref 4.0–10.5)

## 2020-09-07 LAB — LIPID PANEL
Cholesterol: 111 mg/dL (ref 0–200)
HDL: 23.6 mg/dL — ABNORMAL LOW (ref >39.00)
LDL Cholesterol: 67 mg/dL (ref 0–99)
NonHDL: 87.17
Total CHOL/HDL Ratio: 5
Triglycerides: 103 mg/dL (ref 0.0–149.0)
VLDL: 20.6 mg/dL (ref 0.0–40.0)

## 2020-09-07 LAB — BASIC METABOLIC PANEL
BUN: 9 mg/dL (ref 6–23)
CO2: 30 mEq/L (ref 19–32)
Calcium: 9.5 mg/dL (ref 8.4–10.5)
Chloride: 102 mEq/L (ref 96–112)
Creatinine, Ser: 1.34 mg/dL (ref 0.40–1.50)
GFR: 60.34 mL/min (ref >60.00)
Glucose, Bld: 79 mg/dL (ref 70–99)
Potassium: 4.3 mEq/L (ref 3.5–5.1)
Sodium: 140 mEq/L (ref 135–145)

## 2020-09-07 LAB — PSA: PSA: 0.9 ng/mL (ref 0.10–4.00)

## 2020-09-07 LAB — HEMOGLOBIN A1C: Hgb A1c MFr Bld: 5.3 % (ref 4.6–6.5)

## 2020-09-21 ENCOUNTER — Encounter: Payer: Self-pay | Admitting: Family Medicine

## 2020-09-21 DIAGNOSIS — E291 Testicular hypofunction: Secondary | ICD-10-CM

## 2020-09-21 MED ORDER — TESTOSTERONE CYPIONATE 100 MG/ML IJ SOLN: 100.0000 mg | INTRAMUSCULAR | 2 refills | Status: DC

## 2020-09-21 NOTE — Addendum Note (Signed)
Addended by: Abbe Amsterdam C on: 09/21/2020 04:24 PM   Modules accepted: Orders

## 2020-10-21 IMAGING — DX DG SHOULDER 2+V*L*
4 series · 4 of 4 positions shown · non-contrast
Comparison: None.

CLINICAL DATA: Left shoulder pain. Possible injury at the gym.

EXAM:
LEFT SHOULDER - 2+ VIEW

[shoulder grashey]
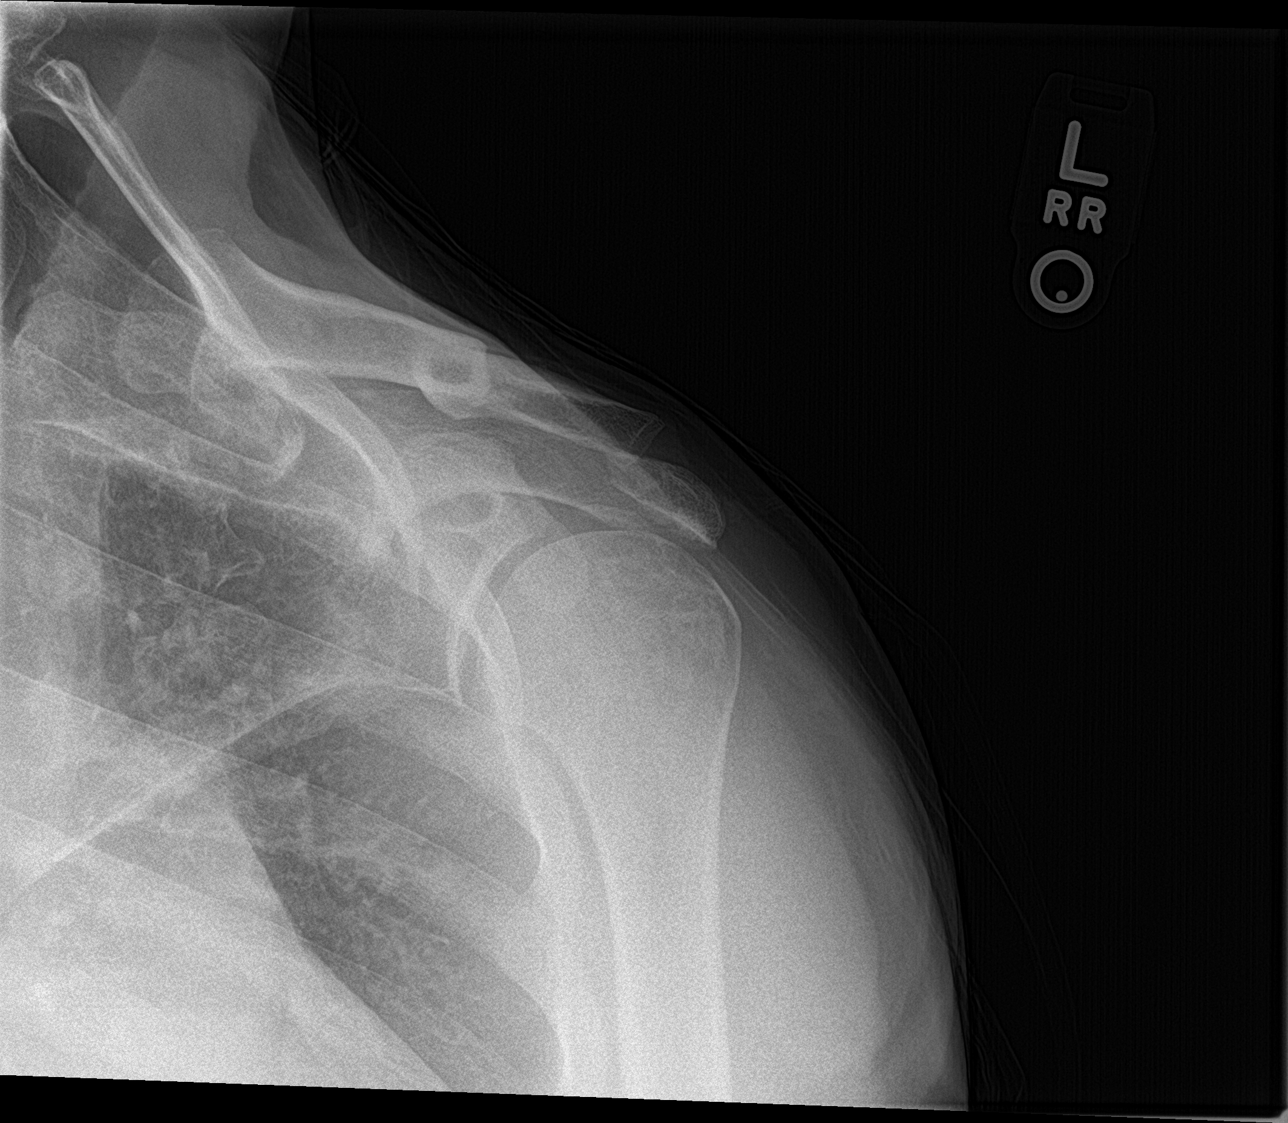

[shoulder y view]
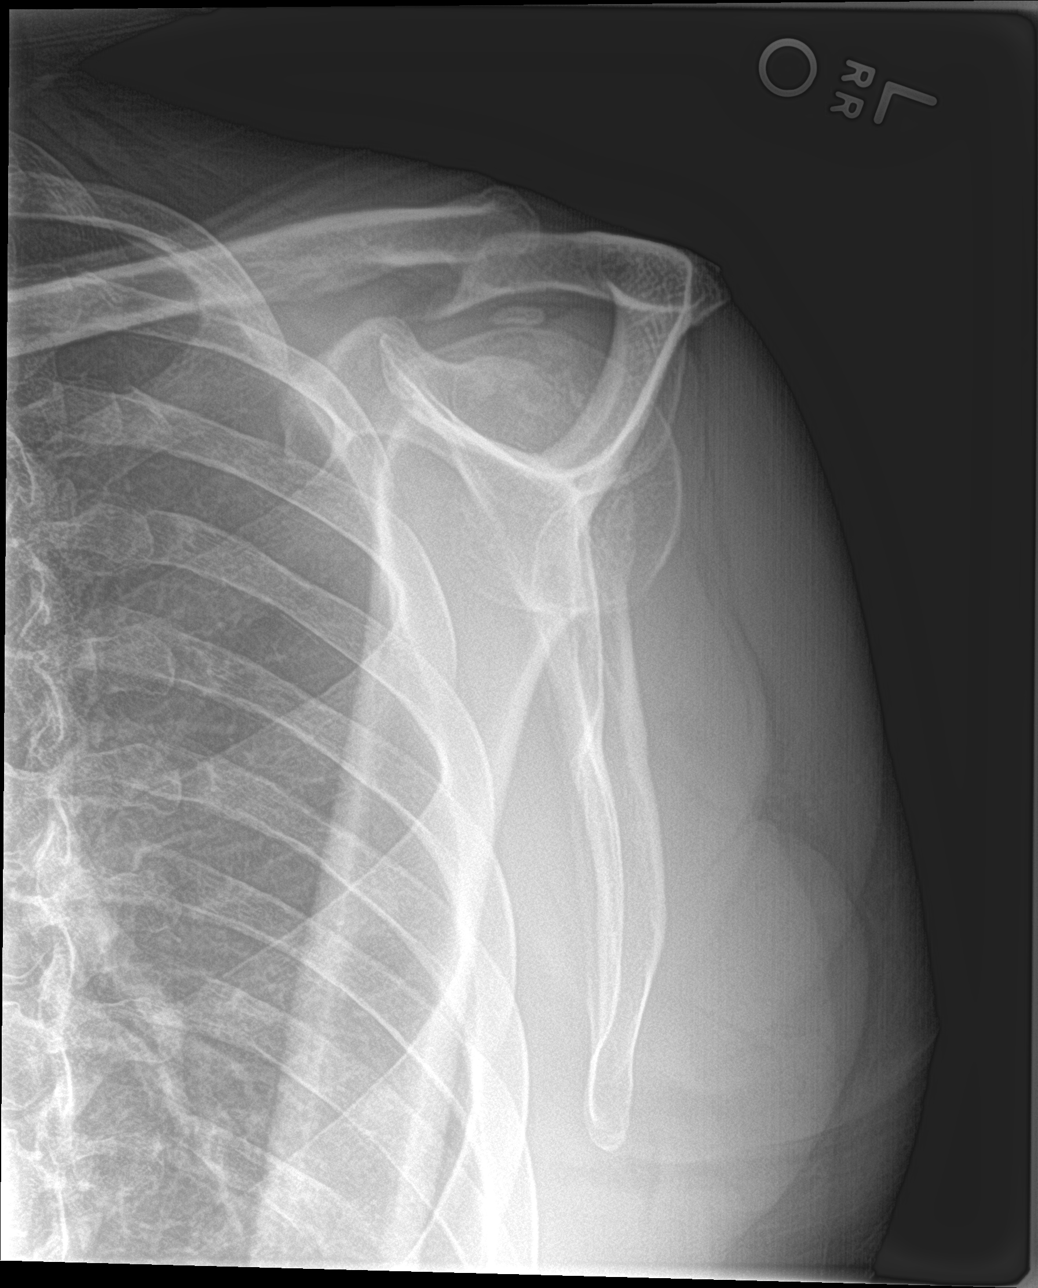

[shoulder axillary]
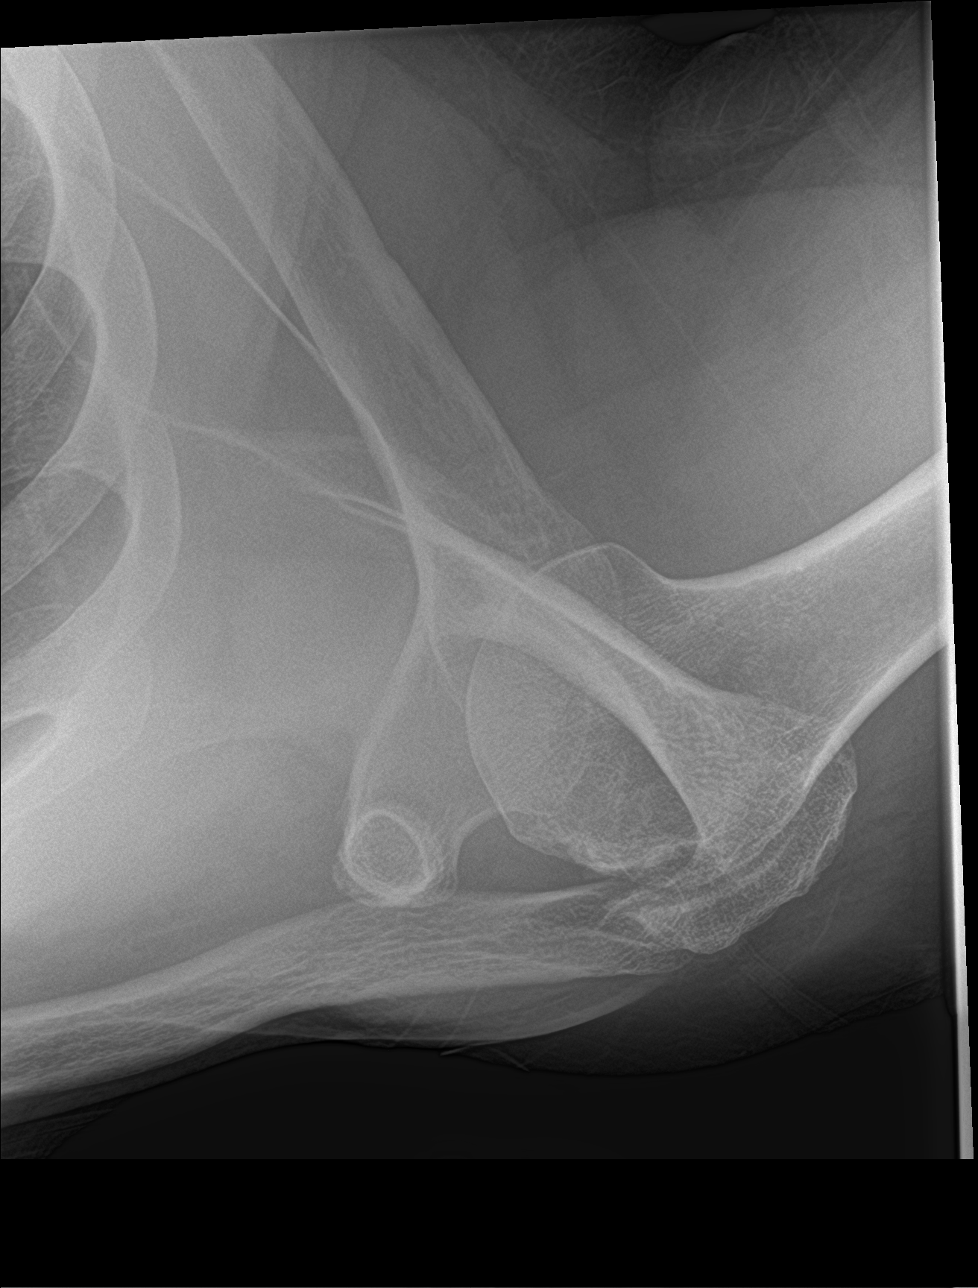

[shoulder ap neutral]
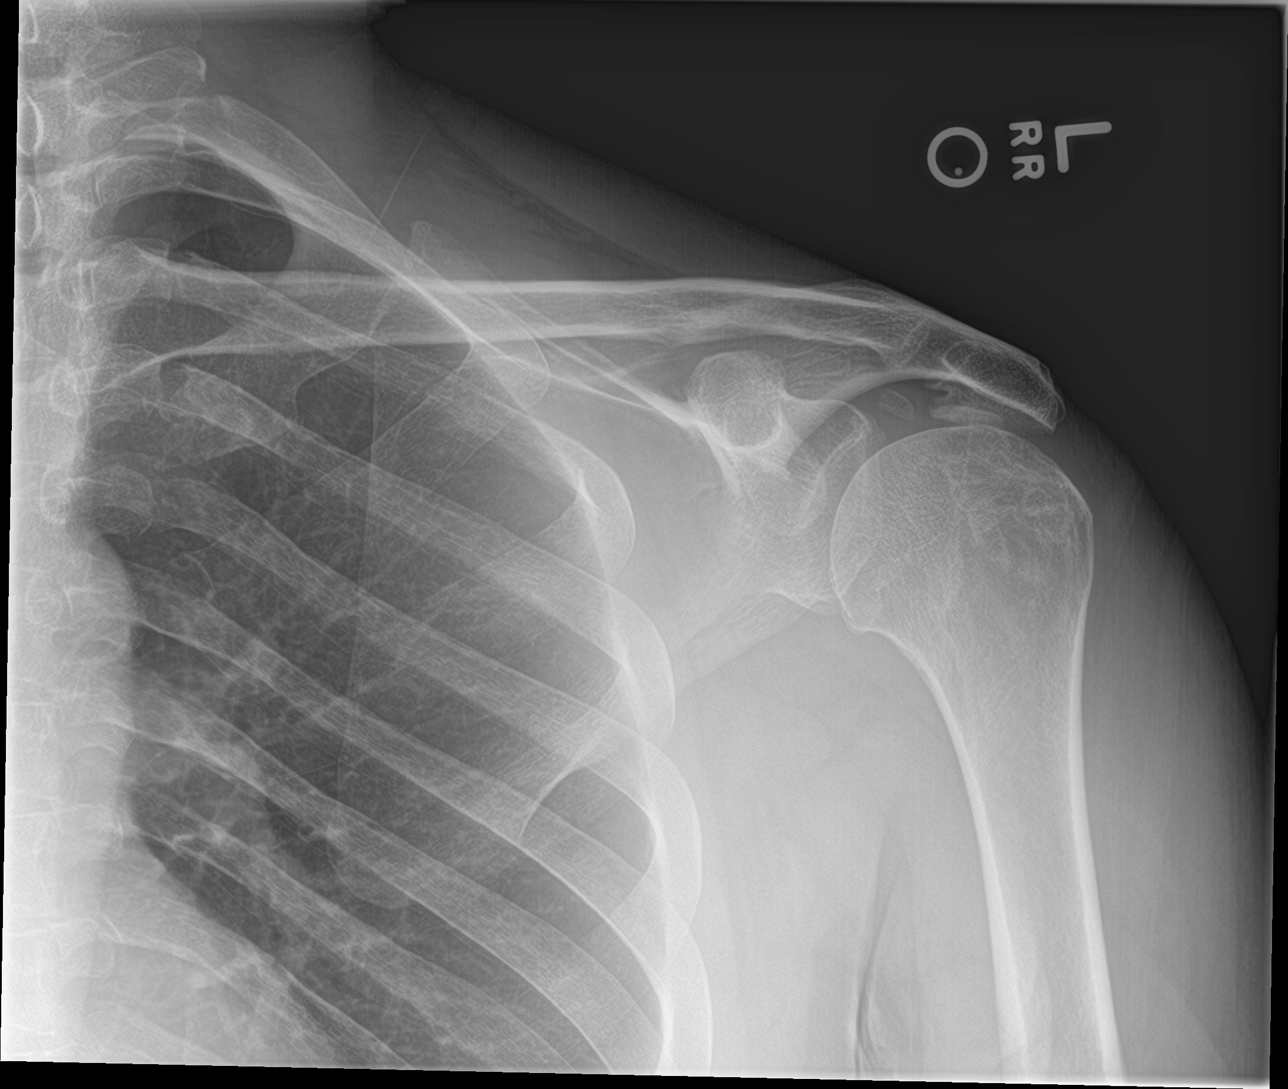

[4 of 4 positions shown; findings below may reference images not displayed]

FINDINGS: There is no evidence of fracture or dislocation. Small subacromial
spurs. Mild glenohumeral osteophytes. Ovoid soft tissue
calcifications adjacent to the superior humeral head.
IMPRESSION: 1. No acute bony abnormality. Mild glenohumeral osteoarthritis and
small subacromial spurs.
2. Soft tissue calcifications adjacent to the superior humeral head,
can be seen with calcific tendinopathy.

## 2021-02-03 NOTE — Patient Instructions (Addendum)
Good to see you again today!  I will be in touch with your labs  We will order a cologuard for you as well Please consider getting shingrix vaccine at your convenience   Purchase a BP cuff for home- we would like to see your BP running about 130/85 or less  Let me know if going higher than this!

## 2021-02-03 NOTE — Progress Notes (Addendum)
[facility] [address] [place], [place] [address] [phone]  Date:  02/08/2021   Name:  Patrick Larsen   DOB:  Apr 07, 1966   MRN:  [id number]  PCP:  Pearline Cables, MD    Chief Complaint: Annual Exam (cpe)   History of Present Illness:  Patrick Larsen is a 55 y.o. very pleasant male patient who presents with the following:  Pt seen today for a CPE- history of hypogonadism, pre-diabetes Last visit with myself in February  T levels in February appropriate   At our last visit he was transitioning into a job as a [employer]-he is now doing this job, notes it is interesting work.  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  There is some conflict in his family because he is traveling so much right now  Shingrix- he declines today, he is thinking about this  Covid vaccine- done with 2 boosters  Cologuard update due - will order for him   T cypionate 100 mg weekly,  his shot is due today  Omega 3  Lipids checked in February - had gone up   BP Readings from Last 3 Encounters:  02/08/21 (!) 154/78  09/06/20 122/80  05/04/20 122/80    Patient Active Problem List   Diagnosis Date Noted   Hypogonadism in male 05/13/2020   Prediabetes 05/02/2020   Injury of left foot 03/04/2016   Chronic low back pain 08/28/2015    No past medical history on file.  Past Surgical History:  Procedure Laterality Date   FRACTURE SURGERY      Social History   Tobacco Use   Smoking status: Never   Smokeless tobacco: Never    Family History  Problem Relation Age of Onset   Cancer Mother     No Known Allergies  Medication list has been reviewed and updated.  Current Outpatient Medications on File Prior to Visit  Medication Sig Dispense Refill    CALCIUM-MAGNESIUM-VITAMIN D PO Take 2 tablets by mouth daily.     Cholecalciferol (VITAMIN D3) 5000 units TABS Take 1 tablet by mouth daily.     Multiple Vitamin (MULTIVITAMIN) tablet Take 3 tablets by mouth daily.     Omega-3 Fatty Acids (THE VERY FINEST FISH OIL) LIQD Take by mouth.     Potassium 99 MG TABS Take 1 tablet by mouth daily.     Probiotic Product (ACIDOPHILUS/GOAT MILK) CAPS Take 2 capsules by mouth daily.     saw palmetto 500 MG capsule Take 500 mg by mouth daily.     SYRINGE-NEEDLE, DISP, 3 ML (BD ECLIPSE SYRINGE) 22G X 1" 3 ML MISC Use as directed to administer Testosterone injection. E34.9 DX code 100 each 3   Testosterone Cypionate 100 MG/ML SOLN Inject 100 mg as directed once a week. 10 mL 2   vitamin C (ASCORBIC ACID) 500 MG tablet Take 500 mg by mouth daily.     No current facility-administered medications on file prior to visit.    Review of Systems:  As per HPI- otherwise negative.   Physical Examination: Vitals:   02/08/21 0905  BP: (!) 154/78  Pulse: 78  Temp: 97.9 F (36.6 C)  SpO2: 99%   Vitals:   02/08/21 0905  Weight: 207 lb 12.8 oz (94.3 kg)  Height: 6\' 1"  (1.854 m)   Body mass index is 27.42 kg/m. Ideal Body Weight: Weight in (lb) to have BMI = 25: 189.1  GEN: no acute distress.  Looks well, muscular build  HEENT: Atraumatic, Normocephalic.  Ears and Nose: No external deformity. CV: RRR, No M/G/R. No JVD. No thrill. No extra heart sounds. PULM: CTA B, no wheezes, crackles, rhonchi. No retractions. No resp. distress. No accessory muscle use. ABD: S, NT, ND, +BS. No rebound. No HSM. EXTR: No c/c/e PSYCH: Normally interactive. Conversant.    Assessment and Plan: Physical exam  Hypogonadism in male - Plan: CBC, Comprehensive metabolic panel, Testosterone Total,Free,Bio, Males-([facility])  Prediabetes - Plan: Hemoglobin A1c  Screening for hyperlipidemia - Plan: Lipid panel  Screening for colon cancer - Plan: Cologuard  Screening for  prostate cancer - Plan: PSA  Encouraged healthy diet and exercise routine Routine labs pending as above Will plan further follow- up pending labs.  This visit occurred during the SARS-CoV-2 public health emergency.  Safety protocols were in place, including screening questions prior to the visit, additional usage of staff PPE, and extensive cleaning of exam room while observing appropriate contact time as indicated for disinfecting solutions.   Signed , MD  Received labs as below, message to pt  Results for orders placed or performed in visit on 02/08/21  CBC  Result Value Ref Range   WBC 5.3 4.0 - 10.5 K/uL   RBC 5.77 4.22 - 5.81 Mil/uL   Platelets 272.0 150.0 - 400.0 K/uL   Hemoglobin 16.3 13.0 - 17.0 g/dL   HCT 02/10/21 51.0 - 25.8 %   MCV 86.7 78.0 - 100.0 fl   MCHC 32.5 30.0 - 36.0 g/dL   RDW 52.7 (H) 78.2 - 42.3 %  Comprehensive metabolic panel  Result Value Ref Range   Sodium 137 135 - 145 mEq/L   Potassium 5.1 3.5 - 5.1 mEq/L   Chloride 100 96 - 112 mEq/L   CO2 30 19 - 32 mEq/L   Glucose, Bld 77 70 - 99 mg/dL   BUN 10 6 - 23 mg/dL   Creatinine, Ser 53.6 0.40 - 1.50 mg/dL   Total Bilirubin 0.8 0.2 - 1.2 mg/dL   Alkaline Phosphatase 55 39 - 117 U/L   AST 28 0 - 37 U/L   ALT 40 0 - 53 U/L   Total Protein 7.1 6.0 - 8.3 g/dL   Albumin 4.2 3.5 - 5.2 g/dL   GFR 1.44 (L) 31.54 mL/min   Calcium 10.4 8.4 - 10.5 mg/dL  Hemoglobin >00.86  Result Value Ref Range   Hgb A1c MFr Bld 5.4 4.6 - 6.5 %  Lipid panel  Result Value Ref Range   Cholesterol 184 0 - 200 mg/dL   Triglycerides P6P 0.0 - 149.0 mg/dL   HDL 950.9 (L) 32.67 mg/dL   VLDL >12.45 0.0 - 80.9 mg/dL   LDL Cholesterol 98.3 (H) 0 - 99 mg/dL   Total CHOL/HDL Ratio 9    NonHDL 162.94   PSA  Result Value Ref Range   PSA 0.98 0.10 - 4.00 ng/mL

## 2021-02-08 ENCOUNTER — Encounter: Payer: Self-pay | Admitting: Family Medicine

## 2021-02-08 ENCOUNTER — Ambulatory Visit (INDEPENDENT_AMBULATORY_CARE_PROVIDER_SITE_OTHER): Payer: No Typology Code available for payment source | Admitting: Family Medicine

## 2021-02-08 ENCOUNTER — Other Ambulatory Visit: Payer: Self-pay

## 2021-02-08 VITALS — BP 135/80 | HR 78 | Temp 97.9°F | Ht 73.0 in | Wt 207.8 lb

## 2021-02-08 DIAGNOSIS — Z0000 Encounter for general adult medical examination without abnormal findings: Secondary | ICD-10-CM | POA: Diagnosis not present

## 2021-02-08 DIAGNOSIS — Z1211 Encounter for screening for malignant neoplasm of colon: Secondary | ICD-10-CM

## 2021-02-08 DIAGNOSIS — Z13220 Encounter for screening for lipoid disorders: Secondary | ICD-10-CM

## 2021-02-08 DIAGNOSIS — Z125 Encounter for screening for malignant neoplasm of prostate: Secondary | ICD-10-CM | POA: Diagnosis not present

## 2021-02-08 DIAGNOSIS — E291 Testicular hypofunction: Secondary | ICD-10-CM

## 2021-02-08 DIAGNOSIS — R7303 Prediabetes: Secondary | ICD-10-CM | POA: Diagnosis not present

## 2021-02-08 LAB — CBC
HCT: 50 % (ref 39.0–52.0)
Hemoglobin: 16.3 g/dL (ref 13.0–17.0)
MCHC: 32.5 g/dL (ref 30.0–36.0)
MCV: 86.7 fl (ref 78.0–100.0)
Platelets: 272 10*3/uL (ref 150.0–400.0)
RBC: 5.77 Mil/uL (ref 4.22–5.81)
RDW: 17.1 % — ABNORMAL HIGH (ref 11.5–15.5)
WBC: 5.3 10*3/uL (ref 4.0–10.5)

## 2021-02-08 LAB — COMPREHENSIVE METABOLIC PANEL
ALT: 40 U/L (ref 0–53)
AST: 28 U/L (ref 0–37)
Albumin: 4.2 g/dL (ref 3.5–5.2)
Alkaline Phosphatase: 55 U/L (ref 39–117)
BUN: 10 mg/dL (ref 6–23)
CO2: 30 mEq/L (ref 19–32)
Calcium: 10.4 mg/dL (ref 8.4–10.5)
Chloride: 100 mEq/L (ref 96–112)
Creatinine, Ser: 1.44 mg/dL (ref 0.40–1.50)
GFR: 55.19 mL/min — ABNORMAL LOW (ref >60.00)
Glucose, Bld: 77 mg/dL (ref 70–99)
Potassium: 5.1 mEq/L (ref 3.5–5.1)
Sodium: 137 mEq/L (ref 135–145)
Total Bilirubin: 0.8 mg/dL (ref 0.2–1.2)
Total Protein: 7.1 g/dL (ref 6.0–8.3)

## 2021-02-08 LAB — LIPID PANEL
Cholesterol: 184 mg/dL (ref 0–200)
HDL: 20.6 mg/dL — ABNORMAL LOW (ref >39.00)
LDL Cholesterol: 142 mg/dL — ABNORMAL HIGH (ref 0–99)
NonHDL: 162.94
Total CHOL/HDL Ratio: 9
Triglycerides: 104 mg/dL (ref 0.0–149.0)
VLDL: 20.8 mg/dL (ref 0.0–40.0)

## 2021-02-08 LAB — HEMOGLOBIN A1C: Hgb A1c MFr Bld: 5.4 % (ref 4.6–6.5)

## 2021-02-08 LAB — PSA: PSA: 0.98 ng/mL (ref 0.10–4.00)

## 2021-02-09 LAB — TESTOSTERONE TOTAL,FREE,BIO, MALES
Albumin: 4 g/dL (ref 3.6–5.1)
Sex Hormone Binding: 6 nmol/L — ABNORMAL LOW (ref 10–50)
Testosterone, Bioavailable: 334.2 ng/dL (ref 110.0–575.0)
Testosterone, Free: 181.7 pg/mL (ref 46.0–224.0)
Testosterone: 468 ng/dL (ref 250–827)

## 2021-05-01 ENCOUNTER — Other Ambulatory Visit: Payer: Self-pay | Admitting: Family Medicine

## 2021-05-01 DIAGNOSIS — E291 Testicular hypofunction: Secondary | ICD-10-CM

## 2021-07-09 ENCOUNTER — Encounter: Payer: Self-pay | Admitting: Family Medicine

## 2021-07-09 DIAGNOSIS — E291 Testicular hypofunction: Secondary | ICD-10-CM

## 2021-07-10 ENCOUNTER — Encounter: Payer: Self-pay | Admitting: Family

## 2021-07-11 MED ORDER — TESTOSTERONE CYPIONATE 200 MG/ML IM SOLN: 100.0000 mg | INTRAMUSCULAR | 1 refills | Status: DC

## 2021-08-05 ENCOUNTER — Encounter: Payer: Self-pay | Admitting: Family Medicine

## 2021-08-05 NOTE — Progress Notes (Addendum)
[facility] [place], [place], [place] [address] [phone]  Date:  08/08/2021   Name:  Patrick Larsen   DOB:  1966/04/20   MRN:  [id number]  PCP:  Darreld Mclean, MD    Chief Complaint: labs and follow up (Concerns/ questions: shortage on )   History of Present Illness:  Patrick Larsen is a 56 y.o. very pleasant male patient who presents with the following:  Patient is seen today to follow-up on lab work for his testosterone replacement therapy Most recent visit with myself was in July-lab work was done at that time  Raquel Sarna had recently contacted me-the pharmacy did not have his usual strength of testosterone injection, he had been taking 1 mL of the 100 mg per 1 mL.  We had him substitute half an mL of the 247m/1ml strength-however, he is actually not currently taking testosterone.  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  This is fine, referral placed  Declines a flu shot today - he did have a reaction once  We discussed shingrix- he declines for now   Patient Active Problem List   Diagnosis Date Noted   Hypogonadism in male 05/13/2020   Prediabetes 05/02/2020   Injury of left foot 03/04/2016   Chronic low back pain 08/28/2015    No past medical history on file.  Past Surgical History:  Procedure Laterality Date   FRACTURE SURGERY      Social History   Tobacco Use   Smoking status: Never   Smokeless tobacco: Never    Family History  Problem Relation Age of Onset   Cancer Mother     No Known Allergies  Medication list has been reviewed and updated.  Current Outpatient Medications on File  Prior to Visit  Medication Sig Dispense Refill   CALCIUM-MAGNESIUM-VITAMIN D PO Take 2 tablets by mouth daily.     Cholecalciferol (VITAMIN D3) 5000 units TABS Take 1 tablet by mouth daily.     Multiple Vitamin (MULTIVITAMIN) tablet Take 3 tablets by mouth daily.     Omega-3 Fatty Acids (THE VERY FINEST FISH OIL) LIQD Take by mouth.     Potassium 99 MG TABS Take 1 tablet by mouth daily.     Probiotic Product (ACIDOPHILUS/GOAT MILK) CAPS Take 2 capsules by mouth daily.     saw palmetto 500 MG capsule Take 500 mg by mouth daily.     SYRINGE-NEEDLE, DISP, 3 ML (BD ECLIPSE SYRINGE) 22G X 1" 3 ML MISC Use as directed to administer Testosterone injection. E34.9 DX code 100 each 3   testosterone cypionate (DEPO-TESTOSTERONE) 200 MG/ML injection Inject 0.5 mLs (100 mg total) into the muscle once a week. 10 mL 1   vitamin C (ASCORBIC ACID) 500 MG tablet Take 500 mg by mouth daily.     No current facility-administered medications on file prior to visit.    Review of Systems:  As per HPI- otherwise negative.   Physical Examination: Vitals:   08/08/21 1002  BP: 122/72  Pulse: 77  Resp: 18  Temp:  97.8 F (36.6 C)  SpO2: 97%   Vitals:   08/08/21 1002  Weight: 190 lb 12.8 oz (86.5 kg)  Height: _0  (1.854 m)   Body mass index is 25.17 kg/m. Ideal Body Weight: Weight in (lb) to have BMI = 25: 189.1  GEN: no acute distress.  Normal weight, looks well HEENT: Atraumatic, Normocephalic.  Ears and Nose: No external deformity. CV: RRR, No M/G/R. No JVD. No thrill. No extra heart sounds. PULM: CTA B, no wheezes, crackles, rhonchi. No retractions. No resp. distress. No accessory muscle use. ABD: S, NT, ND No rebound. No HSM. EXTR: No c/c/e PSYCH: Normally interactive. Conversant.    Assessment and Plan: Hypogonadism in male - Plan: Basic metabolic panel, CBC, PSA, Testosterone Total,Free,Bio, Males-([facility])  Hyperlipidemia, unspecified hyperlipidemia type - Plan: Lipid panel  Screening  for colon cancer - Plan: Ambulatory referral to Gastroenterology  Following up today on hypogonadism.  He has paused his testosterone injections, will check levels and labs as above Follow-up on increased lipids today, lipid panel pending Referral to GI for colonoscopy   Signed Lamar Blinks, MD  Received labs as below, message to patient  Results for orders placed or performed in visit on 28/78/67  Basic metabolic panel  Result Value Ref Range   Sodium 139 135 - 145 mEq/L   Potassium 4.6 3.5 - 5.1 mEq/L   Chloride 101 96 - 112 mEq/L   CO2 32 19 - 32 mEq/L   Glucose, Bld 76 70 - 99 mg/dL   BUN 19 6 - 23 mg/dL   Creatinine, Ser 1.23 0.40 - 1.50 mg/dL   GFR 66.44 >60.00 mL/min   Calcium 10.1 8.4 - 10.5 mg/dL  CBC  Result Value Ref Range   WBC 5.9 4.0 - 10.5 K/uL   RBC 5.03 4.22 - 5.81 Mil/uL   Platelets 204.0 150.0 - 400.0 K/uL   Hemoglobin 14.7 13.0 - 17.0 g/dL   HCT 45.0 39.0 - 52.0 %   MCV 89.5 78.0 - 100.0 fl   MCHC 32.7 30.0 - 36.0 g/dL   RDW 14.9 11.5 - 15.5 %  PSA  Result Value Ref Range   PSA 1.19 0.10 - 4.00 ng/mL  Lipid panel  Result Value Ref Range   Cholesterol 159 0 - 200 mg/dL   Triglycerides 67.0 0.0 - 149.0 mg/dL   HDL 58.20 >39.00 mg/dL   VLDL 13.4 0.0 - 40.0 mg/dL   LDL Cholesterol 87 0 - 99 mg/dL   Total CHOL/HDL Ratio 3    NonHDL 100.30

## 2021-08-08 ENCOUNTER — Ambulatory Visit (INDEPENDENT_AMBULATORY_CARE_PROVIDER_SITE_OTHER): Payer: BC Managed Care – PPO | Admitting: Family Medicine

## 2021-08-08 ENCOUNTER — Encounter: Payer: Self-pay | Admitting: Family Medicine

## 2021-08-08 VITALS — BP 122/72 | HR 77 | Temp 97.8°F | Resp 18 | Ht 73.0 in | Wt 190.8 lb

## 2021-08-08 DIAGNOSIS — Z1211 Encounter for screening for malignant neoplasm of colon: Secondary | ICD-10-CM

## 2021-08-08 DIAGNOSIS — E785 Hyperlipidemia, unspecified: Secondary | ICD-10-CM

## 2021-08-08 DIAGNOSIS — E291 Testicular hypofunction: Secondary | ICD-10-CM

## 2021-08-08 LAB — LIPID PANEL
Cholesterol: 159 mg/dL (ref 0–200)
HDL: 58.2 mg/dL (ref >39.00)
LDL Cholesterol: 87 mg/dL (ref 0–99)
NonHDL: 100.3
Total CHOL/HDL Ratio: 3
Triglycerides: 67 mg/dL (ref 0.0–149.0)
VLDL: 13.4 mg/dL (ref 0.0–40.0)

## 2021-08-08 LAB — CBC
HCT: 45 % (ref 39.0–52.0)
Hemoglobin: 14.7 g/dL (ref 13.0–17.0)
MCHC: 32.7 g/dL (ref 30.0–36.0)
MCV: 89.5 fl (ref 78.0–100.0)
Platelets: 204 10*3/uL (ref 150.0–400.0)
RBC: 5.03 Mil/uL (ref 4.22–5.81)
RDW: 14.9 % (ref 11.5–15.5)
WBC: 5.9 10*3/uL (ref 4.0–10.5)

## 2021-08-08 LAB — TESTOSTERONE TOTAL,FREE,BIO, MALES
Albumin: 4.3 g/dL (ref 3.6–5.1)
Sex Hormone Binding: 31 nmol/L (ref 10–50)
Testosterone, Bioavailable: 144.8 ng/dL (ref 110.0–575.0)
Testosterone, Free: 73.5 pg/mL (ref 46.0–224.0)
Testosterone: 508 ng/dL (ref 250–827)

## 2021-08-08 LAB — BASIC METABOLIC PANEL
BUN: 19 mg/dL (ref 6–23)
CO2: 32 mEq/L (ref 19–32)
Calcium: 10.1 mg/dL (ref 8.4–10.5)
Chloride: 101 mEq/L (ref 96–112)
Creatinine, Ser: 1.23 mg/dL (ref 0.40–1.50)
GFR: 66.44 mL/min (ref >60.00)
Glucose, Bld: 76 mg/dL (ref 70–99)
Potassium: 4.6 mEq/L (ref 3.5–5.1)
Sodium: 139 mEq/L (ref 135–145)

## 2021-08-08 LAB — PSA: PSA: 1.19 ng/mL (ref 0.10–4.00)

## 2021-08-09 ENCOUNTER — Encounter: Payer: Self-pay | Admitting: Family Medicine

## 2022-02-08 NOTE — Patient Instructions (Incomplete)
It was good to see you again today!  Enjoy the rest of the summer  I will be in touch with your labs asap Please give [facility] a call and get set up for your colon  Address: [address] [place], [place] [address] Phone: [phone]  Lab Results  Component Value Date   PSA 1.19 08/08/2021   PSA 0.98 02/08/2021   PSA 0.90 09/06/2020

## 2022-02-08 NOTE — Progress Notes (Unsigned)
[facility] [address] [place], [place] [address] [phone]  Date:  02/11/2022   Name:  Patrick Larsen   DOB:  1965-07-16   MRN:  [id number]  PCP:  Pearline Cables, MD    Chief Complaint: No chief complaint on file.   History of Present Illness:  Patrick Larsen is a 56 y.o. very pleasant male patient who presents with the following:  Pt seen today for a CPE- history of prediabetes and hypogonadism  Last visit with myself was in January - at that time he had come off T therapy and had a new job  T was over 500 in January  I referred him to GI in January for colon cancer screening:   Lab Results  Component Value Date   HGBA1C 5.4 02/08/2021   Shingirx In January did BMP, lipid, cbc, PSA   Patient Active Problem List   Diagnosis Date Noted   Hypogonadism in male 05/13/2020   Prediabetes 05/02/2020   Injury of left foot 03/04/2016   Chronic low back pain 08/28/2015    No past medical history on file.  Past Surgical History:  Procedure Laterality Date   FRACTURE SURGERY      Social History   Tobacco Use   Smoking status: Never   Smokeless tobacco: Never    Family History  Problem Relation Age of Onset   Cancer Mother     No Known Allergies  Medication list has been reviewed and updated.  Current Outpatient Medications on File Prior to Visit  Medication Sig Dispense Refill   CALCIUM-MAGNESIUM-VITAMIN D PO Take 2 tablets by mouth daily.     Cholecalciferol (VITAMIN D3) 5000 units TABS Take 1 tablet by mouth daily.     Multiple Vitamin (MULTIVITAMIN) tablet Take 3 tablets by mouth daily.     Omega-3 Fatty Acids (THE VERY FINEST FISH OIL) LIQD Take by mouth.     Potassium 99 MG TABS Take 1 tablet by mouth daily.     Probiotic Product (ACIDOPHILUS/GOAT MILK) CAPS Take 2 capsules by mouth daily.     saw palmetto 500 MG capsule Take 500 mg by mouth daily.     SYRINGE-NEEDLE, DISP, 3 ML  (BD ECLIPSE SYRINGE) 22G X 1" 3 ML MISC Use as directed to administer Testosterone injection. E34.9 DX code 100 each 3   testosterone cypionate (DEPO-TESTOSTERONE) 200 MG/ML injection Inject 0.5 mLs (100 mg total) into the muscle once a week. 10 mL 1   vitamin C (ASCORBIC ACID) 500 MG tablet Take 500 mg by mouth daily.     No current facility-administered medications on file prior to visit.    Review of Systems:  As per HPI- otherwise negative.   Physical Examination: There were no vitals filed for this visit. There were no vitals filed for this visit. There is no height or weight on file to calculate BMI. Ideal Body Weight:    GEN: no acute distress. HEENT: Atraumatic, Normocephalic.  Ears and Nose: No external deformity. CV: RRR, No M/G/R. No JVD. No thrill. No extra heart sounds. PULM: CTA B, no wheezes, crackles, rhonchi. No retractions. No resp. distress. No accessory muscle use. ABD: S, NT, ND, +BS. No rebound. No HSM. EXTR: No c/c/e PSYCH: Normally interactive. Conversant.    Assessment and Plan: ***  Signed Abbe Amsterdam, MD

## 2022-02-11 ENCOUNTER — Ambulatory Visit (INDEPENDENT_AMBULATORY_CARE_PROVIDER_SITE_OTHER): Payer: BC Managed Care – PPO | Admitting: Family Medicine

## 2022-02-11 ENCOUNTER — Encounter: Payer: Self-pay | Admitting: Family Medicine

## 2022-02-11 VITALS — BP 122/70 | HR 75 | Temp 97.9°F | Resp 18 | Ht 73.0 in | Wt 213.8 lb

## 2022-02-11 DIAGNOSIS — Z0000 Encounter for general adult medical examination without abnormal findings: Secondary | ICD-10-CM

## 2022-02-11 DIAGNOSIS — Z125 Encounter for screening for malignant neoplasm of prostate: Secondary | ICD-10-CM

## 2022-02-11 DIAGNOSIS — R7303 Prediabetes: Secondary | ICD-10-CM

## 2022-02-11 DIAGNOSIS — R972 Elevated prostate specific antigen [PSA]: Secondary | ICD-10-CM

## 2022-02-11 LAB — COMPREHENSIVE METABOLIC PANEL
ALT: 25 U/L (ref 0–53)
AST: 33 U/L (ref 0–37)
Albumin: 4.3 g/dL (ref 3.5–5.2)
Alkaline Phosphatase: 51 U/L (ref 39–117)
BUN: 13 mg/dL (ref 6–23)
CO2: 30 mEq/L (ref 19–32)
Calcium: 9.8 mg/dL (ref 8.4–10.5)
Chloride: 102 mEq/L (ref 96–112)
Creatinine, Ser: 1.49 mg/dL (ref 0.40–1.50)
GFR: 52.6 mL/min — ABNORMAL LOW (ref >60.00)
Glucose, Bld: 82 mg/dL (ref 70–99)
Potassium: 5.4 mEq/L — ABNORMAL HIGH (ref 3.5–5.1)
Sodium: 138 mEq/L (ref 135–145)
Total Bilirubin: 0.9 mg/dL (ref 0.2–1.2)
Total Protein: 7.1 g/dL (ref 6.0–8.3)

## 2022-02-11 LAB — PSA: PSA: 0.77 ng/mL (ref 0.10–4.00)

## 2022-02-11 LAB — HEMOGLOBIN A1C: Hgb A1c MFr Bld: 5.3 % (ref 4.6–6.5)

## 2022-11-25 DIAGNOSIS — H40013 Open angle with borderline findings, low risk, bilateral: Secondary | ICD-10-CM | POA: Diagnosis not present

## 2023-02-11 NOTE — Patient Instructions (Addendum)
It was good to see you again today, I will be in touch with your lab work!    Please say Hi to [facility] for me!   Please do get this colonoscopy done- you are well overdue  Recommend the shingles vaccine series at your convenience and a flu/ covid booster this fall

## 2023-02-11 NOTE — Progress Notes (Signed)
[facility] [address], [address] [place], [place] [address] [phone]  Date:  02/17/2023   Name:  Patrick Larsen   DOB:  [birth year]   MRN:  [id number]  PCP:  Pearline Cables, MD    Chief Complaint: Annual Exam (Concerns/ questions: pt would like to have testosterone checked for possible TRT/Shingrix: none/Cologuard- refer has been placed for colonoscopy/Tdap: pt says this is UTD)   History of Present Illness:  Patrick Larsen is a 57 y.o. 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 He would like to do a colonscopy.  We will get this set up for him  Shingles vaccine- he is thinking about this  Recommend COVID booster, flu shot this fall May be due for tetanus booster- he thinks this was done at [facility] for a job in 07/2017  Continues to get plenty of exercise  He notes he has gained a few lbs which is frustrating to him He is doing both weights and cardio  He would like to check his T levels- last took T about 17 months ago.  He wonders if this could be part of his weight gain  He would like to see a nutritionist -advised him most of my nutrition referrals are for people needing more basic information that he is looking for.  He will do some research and let me know if he needs a referral to a specific provider  Patient Active Problem List   Diagnosis Date Noted   Hypogonadism in male 05/13/2020   Prediabetes 05/02/2020   Injury of left foot  03/04/2016   Chronic low back pain 08/28/2015    No past medical history on file.  Past Surgical History:  Procedure Laterality Date   FRACTURE SURGERY      Social History   Tobacco Use   Smoking status: Never   Smokeless tobacco: Never    Family History  Problem Relation Age of Onset   Cancer Mother     No Known Allergies  Medication list has been reviewed and updated.  Current Outpatient Medications on File Prior to Visit  Medication Sig Dispense Refill   CALCIUM-MAGNESIUM-VITAMIN D PO Take 2 tablets by mouth daily.     Cholecalciferol (VITAMIN D3) 5000 units TABS Take 1 tablet by mouth daily.     Multiple Vitamin (MULTIVITAMIN) tablet Take 3 tablets by mouth daily.     multivitamin-lutein (OCUVITE-LUTEIN) CAPS capsule Take 1 capsule by mouth daily.     Omega-3 Fatty Acids (THE VERY FINEST FISH OIL) LIQD Take by mouth.     Potassium 99 MG TABS Take 1 tablet by mouth daily.     Probiotic Product (ACIDOPHILUS/GOAT MILK) CAPS Take 2 capsules by mouth daily.     saw palmetto 500 MG capsule Take 500 mg by mouth daily.     SYRINGE-NEEDLE, DISP, 3 ML (BD ECLIPSE SYRINGE) 22G X 1" 3 ML MISC Use as directed to administer Testosterone  injection. E34.9 DX code 100 each 3   vitamin C (ASCORBIC ACID) 500 MG tablet Take 500 mg by mouth daily.     No current facility-administered medications on file prior to visit.    Review of Systems:  As per HPI- otherwise negative.   Physical Examination: Vitals:   02/17/23 0847  BP: 130/80  Pulse: 77  Resp: 18  Temp: 97.8 F (36.6 C)  SpO2: 98%   Vitals:   02/17/23 0847  Weight: 221 lb (100.2 kg)  Height: 6\' 1"  (1.854 m)   Body mass index is 29.16 kg/m. Ideal Body Weight: Weight in (lb) to have BMI = 25: 189.1  GEN: no acute distress.  Looks well, mild overweight but also muscular build HEENT: Atraumatic, Normocephalic.  Bilateral TM wnl, oropharynx normal.  PEERL,EOMI.   Ears and Nose: No external deformity. CV: RRR, No  M/G/R. No JVD. No thrill. No extra heart sounds. PULM: CTA B, no wheezes, crackles, rhonchi. No retractions. No resp. distress. No accessory muscle use. ABD: S, NT, ND, +BS. No rebound. No HSM. EXTR: No c/c/e PSYCH: Normally interactive. Conversant.   Wt Readings from Last 3 Encounters:  02/17/23 221 lb (100.2 kg)  02/11/22 213 lb 12.8 oz (97 kg)  08/08/21 190 lb 12.8 oz (86.5 kg)    Assessment and Plan: Physical exam  Prediabetes - Plan: Comprehensive metabolic panel, Hemoglobin A1c  Screening for prostate cancer - Plan: PSA  Hyperlipidemia, unspecified hyperlipidemia type - Plan: Lipid panel  Screening for deficiency anemia - Plan: CBC  Screening for colon cancer - Plan: Ambulatory referral to Gastroenterology  Weight gain - Plan: Testosterone Total,Free,Bio, Males-([facility])  Physical exam today.  Encouraged continued healthy diet and exercise routine Will plan further follow- up pending labs. Referral made to GI for screening colonoscopy Will follow-up on testosterone levels today, patient wonders if his levels could be low Recommend shingles series, COVID and flu shot this fall  Signed Abbe Amsterdam, MD  Received labs as below, message to patient  Results for orders placed or performed in visit on 02/17/23  CBC  Result Value Ref Range   WBC 6.7 4.0 - 10.5 K/uL   RBC 4.66 4.22 - 5.81 Mil/uL   Platelets 234.0 150.0 - 400.0 K/uL   Hemoglobin 14.0 13.0 - 17.0 g/dL   HCT 16.1 09.6 - 04.5 %   MCV 90.5 78.0 - 100.0 fl   MCHC 33.1 30.0 - 36.0 g/dL   RDW 40.9 81.1 - 91.4 %  Comprehensive metabolic panel  Result Value Ref Range   Sodium 140 135 - 145 mEq/L   Potassium 4.4 3.5 - 5.1 mEq/L   Chloride 103 96 - 112 mEq/L   CO2 29 19 - 32 mEq/L   Glucose, Bld 85 70 - 99 mg/dL   BUN 18 6 - 23 mg/dL   Creatinine, Ser 7.82 0.40 - 1.50 mg/dL   Total Bilirubin 0.5 0.2 - 1.2 mg/dL   Alkaline Phosphatase 73 39 - 117 U/L   AST 25 0 - 37 U/L   ALT 26 0 - 53 U/L   Total  Protein 7.2 6.0 - 8.3 g/dL   Albumin 4.7 3.5 - 5.2 g/dL   GFR 95.62 (L) >13.08 mL/min   Calcium 10.0 8.4 - 10.5 mg/dL  Hemoglobin M5H  Result Value Ref Range   Hgb A1c MFr Bld 5.5 4.6 - 6.5 %  Lipid panel  Result Value Ref Range   Cholesterol 164 0 - 200 mg/dL   Triglycerides 84.6 0.0 -  149.0 mg/dL   HDL 09.81 >19.14 mg/dL   VLDL 78.2 0.0 - 95.6 mg/dL   LDL Cholesterol 213 (H) 0 - 99 mg/dL   Total CHOL/HDL Ratio 4    NonHDL 119.15   PSA  Result Value Ref Range   PSA 1.06 0.10 - 4.00 ng/mL

## 2023-02-17 ENCOUNTER — Encounter: Payer: Self-pay | Admitting: Family Medicine

## 2023-02-17 ENCOUNTER — Ambulatory Visit (INDEPENDENT_AMBULATORY_CARE_PROVIDER_SITE_OTHER): Payer: BC Managed Care – PPO | Admitting: Family Medicine

## 2023-02-17 VITALS — BP 130/80 | HR 77 | Temp 97.8°F | Resp 18 | Ht 73.0 in | Wt 221.0 lb

## 2023-02-17 DIAGNOSIS — Z130 Encounter for screening for diseases of the blood and blood-forming organs and certain disorders involving the immune mechanism: Secondary | ICD-10-CM | POA: Diagnosis not present

## 2023-02-17 DIAGNOSIS — R7303 Prediabetes: Secondary | ICD-10-CM

## 2023-02-17 DIAGNOSIS — Z1211 Encounter for screening for malignant neoplasm of colon: Secondary | ICD-10-CM

## 2023-02-17 DIAGNOSIS — Z125 Encounter for screening for malignant neoplasm of prostate: Secondary | ICD-10-CM | POA: Diagnosis not present

## 2023-02-17 DIAGNOSIS — Z0000 Encounter for general adult medical examination without abnormal findings: Secondary | ICD-10-CM

## 2023-02-17 DIAGNOSIS — R635 Abnormal weight gain: Secondary | ICD-10-CM

## 2023-02-17 DIAGNOSIS — E785 Hyperlipidemia, unspecified: Secondary | ICD-10-CM | POA: Diagnosis not present

## 2023-02-17 LAB — LIPID PANEL
Cholesterol: 164 mg/dL (ref 0–200)
HDL: 44.7 mg/dL (ref >39.00)
LDL Cholesterol: 101 mg/dL — ABNORMAL HIGH (ref 0–99)
NonHDL: 119.15
Total CHOL/HDL Ratio: 4
Triglycerides: 89 mg/dL (ref 0.0–149.0)
VLDL: 17.8 mg/dL (ref 0.0–40.0)

## 2023-02-17 LAB — COMPREHENSIVE METABOLIC PANEL
ALT: 26 U/L (ref 0–53)
AST: 25 U/L (ref 0–37)
Albumin: 4.7 g/dL (ref 3.5–5.2)
Alkaline Phosphatase: 73 U/L (ref 39–117)
BUN: 18 mg/dL (ref 6–23)
CO2: 29 mEq/L (ref 19–32)
Calcium: 10 mg/dL (ref 8.4–10.5)
Chloride: 103 mEq/L (ref 96–112)
Creatinine, Ser: 1.44 mg/dL (ref 0.40–1.50)
GFR: 54.41 mL/min — ABNORMAL LOW (ref >60.00)
Glucose, Bld: 85 mg/dL (ref 70–99)
Potassium: 4.4 mEq/L (ref 3.5–5.1)
Sodium: 140 mEq/L (ref 135–145)
Total Bilirubin: 0.5 mg/dL (ref 0.2–1.2)
Total Protein: 7.2 g/dL (ref 6.0–8.3)

## 2023-02-17 LAB — CBC
HCT: 42.2 % (ref 39.0–52.0)
Hemoglobin: 14 g/dL (ref 13.0–17.0)
MCHC: 33.1 g/dL (ref 30.0–36.0)
MCV: 90.5 fl (ref 78.0–100.0)
Platelets: 234 10*3/uL (ref 150.0–400.0)
RBC: 4.66 Mil/uL (ref 4.22–5.81)
RDW: 13.1 % (ref 11.5–15.5)
WBC: 6.7 10*3/uL (ref 4.0–10.5)

## 2023-02-17 LAB — PSA: PSA: 1.06 ng/mL (ref 0.10–4.00)

## 2023-02-17 LAB — HEMOGLOBIN A1C: Hgb A1c MFr Bld: 5.5 % (ref 4.6–6.5)

## 2023-02-18 ENCOUNTER — Encounter: Payer: Self-pay | Admitting: Family Medicine

## 2023-02-25 DIAGNOSIS — H40013 Open angle with borderline findings, low risk, bilateral: Secondary | ICD-10-CM | POA: Diagnosis not present

## 2023-04-11 ENCOUNTER — Ambulatory Visit (AMBULATORY_SURGERY_CENTER): Payer: BC Managed Care – PPO

## 2023-04-11 VITALS — Ht 73.0 in | Wt 218.0 lb

## 2023-04-11 DIAGNOSIS — Z1211 Encounter for screening for malignant neoplasm of colon: Secondary | ICD-10-CM

## 2023-04-11 MED ORDER — NA SULFATE-K SULFATE-MG SULF 17.5-3.13-1.6 GM/177ML PO SOLN: 1.0000 | Freq: Once | ORAL | 0 refills | Status: AC

## 2023-04-11 NOTE — Progress Notes (Signed)
Pre visit completed via phone call; Patient verified name, DOB, and address; No egg or soy allergy known to patient;  No issues known to pt with past sedation with any surgeries or procedures; Patient denies ever being told they had issues or difficulty with intubation;  No FH of Malignant Hyperthermia; Pt is not on diet pills; Pt is not on home 02;  Pt is not on blood thinners;  Pt denies issues with constipation;  No A fib or A flutter; Have any cardiac testing pending--NO Insurance verified during PV appt--- BCBS Pt can ambulate without assistance;  Pt denies use of chewing tobacco Discussed diabetic/weight loss medication holds; Discussed NSAID holds; Checked BMI to be less than 50; Pt instructed to use Singlecare.com or GoodRx for a price reduction on prep  Patient's chart reviewed by Cathlyn Parsons CNRA prior to previsit and patient appropriate for the [facility].  Pre visit completed and red dot placed by patient's name on their procedure day (on provider's schedule).    Instructions sent to MyChart per patient request;

## 2023-04-29 ENCOUNTER — Encounter: Payer: BC Managed Care – PPO | Admitting: Gastroenterology

## 2023-05-07 ENCOUNTER — Encounter: Payer: Self-pay | Admitting: Gastroenterology

## 2023-05-20 ENCOUNTER — Other Ambulatory Visit: Payer: Self-pay | Admitting: Gastroenterology

## 2023-05-20 ENCOUNTER — Ambulatory Visit: Payer: BC Managed Care – PPO | Admitting: Gastroenterology

## 2023-05-20 ENCOUNTER — Encounter: Payer: Self-pay | Admitting: Gastroenterology

## 2023-05-20 VITALS — BP 138/75 | HR 69 | Temp 97.5°F | Resp 18 | Ht 73.0 in | Wt 218.0 lb

## 2023-05-20 DIAGNOSIS — D123 Benign neoplasm of transverse colon: Secondary | ICD-10-CM | POA: Diagnosis not present

## 2023-05-20 DIAGNOSIS — Z1211 Encounter for screening for malignant neoplasm of colon: Secondary | ICD-10-CM

## 2023-05-20 DIAGNOSIS — K635 Polyp of colon: Secondary | ICD-10-CM

## 2023-05-20 DIAGNOSIS — D122 Benign neoplasm of ascending colon: Secondary | ICD-10-CM

## 2023-05-20 MED ORDER — SODIUM CHLORIDE 0.9 % IV SOLN: 500.0000 mL | Freq: Once | INTRAVENOUS | Status: DC

## 2023-05-20 NOTE — Progress Notes (Signed)
Called to room to assist during endoscopic procedure.  Patient ID and intended procedure confirmed with present staff. Received instructions for my participation in the procedure from the performing physician.  

## 2023-05-20 NOTE — Progress Notes (Signed)
[facility] History and Physical   Primary Care Physician:  Copland, Gwenlyn Found, MD   Reason for Procedure:   Colon cancer screening  Plan:    Screening colonoscopy     HPI: Patrick Larsen is a 57 y.o. male undergoing initial average risk screening colonoscopy.  He has no family history of colon cancer and no chronic GI symptoms.    History reviewed. No pertinent past medical history.  Past Surgical History:  Procedure Laterality Date   FRACTURE SURGERY Left 2014   ring finger   SHOULDER ARTHROSCOPY W/ ROTATOR CUFF REPAIR Left 2020    Prior to Admission medications   Medication Sig Start Date End Date Taking? Authorizing Provider  CALCIUM-MAGNESIUM-VITAMIN D PO Take 2 tablets by mouth daily.   Yes [provider]  Cholecalciferol (VITAMIN D3) 5000 units TABS Take 1 tablet by mouth daily.   Yes [provider]  Multiple Vitamin (MULTIVITAMIN) tablet Take 3 tablets by mouth daily.   Yes [provider]  multivitamin-lutein (OCUVITE-LUTEIN) CAPS capsule Take 1 capsule by mouth daily.   Yes [provider]  Omega-3 Fatty Acids (THE VERY FINEST FISH OIL) LIQD Take by mouth.   Yes [provider]  Potassium 99 MG TABS Take 1 tablet by mouth daily.   Yes [provider]  Probiotic Product (ACIDOPHILUS/GOAT MILK) CAPS Take 1 each by mouth daily.   Yes [provider]  saw palmetto 500 MG capsule Take 500 mg by mouth daily.   Yes [provider]  vitamin C (ASCORBIC ACID) 500 MG tablet Take 500 mg by mouth daily.   Yes [provider]    Current Outpatient Medications  Medication Sig Dispense Refill   CALCIUM-MAGNESIUM-VITAMIN D PO Take 2 tablets by mouth daily.     Cholecalciferol (VITAMIN D3) 5000 units TABS Take 1 tablet by mouth daily.     Multiple Vitamin (MULTIVITAMIN) tablet Take 3 tablets by mouth daily.     multivitamin-lutein (OCUVITE-LUTEIN) CAPS capsule Take 1 capsule by mouth  daily.     Omega-3 Fatty Acids (THE VERY FINEST FISH OIL) LIQD Take by mouth.     Potassium 99 MG TABS Take 1 tablet by mouth daily.     Probiotic Product (ACIDOPHILUS/GOAT MILK) CAPS Take 1 each by mouth daily.     saw palmetto 500 MG capsule Take 500 mg by mouth daily.     vitamin C (ASCORBIC ACID) 500 MG tablet Take 500 mg by mouth daily.     Current Facility-Administered Medications  Medication Dose Route Frequency Provider Last Rate Last Admin   0.9 %  sodium chloride infusion  500 mL Intravenous Once Jenel Lucks, MD        Allergies as of 05/20/2023   (No Known Allergies)    Family History  Problem Relation Age of Onset   Cancer Mother    Colon cancer Neg Hx    Esophageal cancer Neg Hx    Rectal cancer Neg Hx    Stomach cancer Neg Hx     Social History   Socioeconomic History   Marital status: Married    Spouse name: Not on file   Number of children: Not on file   Years of education: Not on file   Highest education level: Not on file  Occupational History   Not on file  Tobacco Use   Smoking status: Never   Smokeless tobacco: Never  Vaping Use   Vaping status: Never Used  Substance and Sexual  Activity   Alcohol use: Never   Drug use: Never   Sexual activity: Not on file  Other Topics Concern   Not on file  Social History Narrative   Not on file   Social Determinants of Health   Financial Resource Strain: Not on file  Food Insecurity: Not on file  Transportation Needs: Not on file  Physical Activity: Not on file  Stress: Not on file  Social Connections: Not on file  Intimate Partner Violence: Not on file    Review of Systems:  All other review of systems negative except as mentioned in the HPI.  Physical Exam: Vital signs BP (!) 156/89   Pulse 82   Temp (!) 97.5 F (36.4 C) (Skin)   Ht 6\' 1"  (1.854 m)   Wt 218 lb (98.9 kg)   SpO2 98%   BMI 28.76 kg/m   General:   Alert,  Well-developed, well-nourished, pleasant and cooperative  in NAD Airway:  Mallampati 1 Lungs:  Clear throughout to auscultation.   Heart:  Regular rate and rhythm; no murmurs, clicks, rubs,  or gallops. Abdomen:  Soft, nontender and nondistended. Normal bowel sounds.   Neuro/Psych:  Normal mood and affect. A and O x 3   Eulah Walkup E. Tomasa Rand, MD [facility]

## 2023-05-20 NOTE — Op Note (Signed)
[facility] Patient Name: Patrick Larsen Procedure Date: 05/20/2023 11:12 AM MRN: [id number] Endoscopist: Lorin Picket E. Tomasa Rand , MD, [id number] Age: 57 Referring MD:  Date of Birth: 1966-02-08 Gender: Male Account #: [id number] Procedure:                Colonoscopy Indications:              Screening for colorectal malignant neoplasm, This                            is the patient's first colonoscopy Medicines:                Monitored Anesthesia Care Procedure:                Pre-Anesthesia Assessment:                           - Prior to the procedure, a History and Physical                            was performed, and patient medications and                            allergies were reviewed. The patient's tolerance of                            previous anesthesia was also reviewed. The risks                            and benefits of the procedure and the sedation                            options and risks were discussed with the patient.                            All questions were answered, and informed consent                            was obtained. Prior Anticoagulants: The patient has                            taken no anticoagulant or antiplatelet agents. ASA                            Grade Assessment: I - A normal, healthy patient.                            After reviewing the risks and benefits, the patient                            was deemed in satisfactory condition to undergo the                            procedure.  After obtaining informed consent, the colonoscope                            was passed under direct vision. Throughout the                            procedure, the patient's blood pressure, pulse, and                            oxygen saturations were monitored continuously. The                            Olympus Scope SN: [id number] was introduced through                            the anus and advanced to the  the terminal ileum,                            with identification of the appendiceal orifice and                            IC valve. The colonoscopy was performed without                            difficulty. The patient tolerated the procedure                            well. The quality of the bowel preparation was                            excellent. The terminal ileum, ileocecal valve,                            appendiceal orifice, and rectum were photographed.                            The bowel preparation used was SUPREP via split                            dose instruction. Scope In: 11:23:57 AM Scope Out: 11:45:29 AM Scope Withdrawal Time: 0 hours 17 minutes 41 seconds  Total Procedure Duration: 0 hours 21 minutes 32 seconds  Findings:                 The perianal and digital rectal examinations were                            normal. Pertinent negatives include normal                            sphincter tone and no palpable rectal lesions.                           Two flat and sessile polyps were found in the  ascending colon. The polyps were 2 to 8 mm in size.                            These polyps were removed with a cold snare.                            Resection and retrieval were complete. Estimated                            blood loss was minimal.                           A 3 mm polyp was found in the transverse colon. The                            polyp was sessile. The polyp was removed with a                            cold snare. Resection and retrieval were complete.                            Estimated blood loss was minimal.                           The exam was otherwise normal throughout the                            examined colon.                           The terminal ileum appeared normal.                           The retroflexed view of the distal rectum and anal                            verge was normal and showed  no anal or rectal                            abnormalities. Complications:            No immediate complications. Estimated Blood Loss:     Estimated blood loss was minimal. Impression:               - Two 2 to 8 mm polyps in the ascending colon,                            removed with a cold snare. Resected and retrieved.                           - One 3 mm polyp in the transverse colon, removed                            with a cold snare. Resected and retrieved.                           -  The examined portion of the ileum was normal.                           - The distal rectum and anal verge are normal on                            retroflexion view. Recommendation:           - Patient has a contact number available for                            emergencies. The signs and symptoms of potential                            delayed complications were discussed with the                            patient. Return to normal activities tomorrow.                            Written discharge instructions were provided to the                            patient.                           - Resume previous diet.                           - Continue present medications.                           - Await pathology results.                           - Repeat colonoscopy (date not yet determined) for                            surveillance based on pathology results. Shakara Tweedy E. Tomasa Rand, MD 05/20/2023 11:51:46 AM This report has been signed electronically.

## 2023-05-20 NOTE — Progress Notes (Signed)
Sedate, gd SR, tolerated procedure well, VSS, report to RN 

## 2023-05-20 NOTE — Progress Notes (Signed)
Pt's states no medical or surgical changes since previsit or office visit. 

## 2023-05-20 NOTE — Patient Instructions (Signed)
Educational handout provided to patient related to Polyps,   Resume previous diet  Continue present medications  Awaiting pathology results  YOU HAD AN ENDOSCOPIC PROCEDURE TODAY AT THE [facility]:   Refer to the procedure report that was given to you for any specific questions about what was found during the examination.  If the procedure report does not answer your questions, please call your gastroenterologist to clarify.  If you requested that your care partner not be given the details of your procedure findings, then the procedure report has been included in a sealed envelope for you to review at your convenience later.  YOU SHOULD EXPECT: Some feelings of bloating in the abdomen. Passage of more gas than usual.  Walking can help get rid of the air that was put into your GI tract during the procedure and reduce the bloating. If you had a lower endoscopy (such as a colonoscopy or flexible sigmoidoscopy) you may notice spotting of blood in your stool or on the toilet paper. If you underwent a bowel prep for your procedure, you may not have a normal bowel movement for a few days.  Please Note:  You might notice some irritation and congestion in your nose or some drainage.  This is from the oxygen used during your procedure.  There is no need for concern and it should clear up in a day or so.  SYMPTOMS TO REPORT IMMEDIATELY:  Following lower endoscopy (colonoscopy or flexible sigmoidoscopy):  Excessive amounts of blood in the stool  Significant tenderness or worsening of abdominal pains  Swelling of the abdomen that is new, acute  Fever of 100F or higher  For urgent or emergent issues, a gastroenterologist can be reached at any hour by calling [phone]. Do not use MyChart messaging for urgent concerns.    DIET:  We do recommend a small meal at first, but then you may proceed to your regular diet.  Drink plenty of fluids but you should avoid alcoholic beverages for 24  hours.  ACTIVITY:  You should plan to take it easy for the rest of today and you should NOT DRIVE or use heavy machinery until tomorrow (because of the sedation medicines used during the test).    FOLLOW UP: Our staff will call the number listed on your records the next business day following your procedure.  We will call around 7:15- 8:00 am to check on you and address any questions or concerns that you may have regarding the information given to you following your procedure. If we do not reach you, we will leave a message.     If any biopsies were taken you will be contacted by phone or by letter within the next 1-3 weeks.  Please call us at [phone] if you have not heard about the biopsies in 3 weeks.    SIGNATURES/CONFIDENTIALITY: You and/or your care partner have signed paperwork which will be entered into your electronic medical record.  These signatures attest to the fact that that the information above on your After Visit Summary has been reviewed and is understood.  Full responsibility of the confidentiality of this discharge information lies with you and/or your care-partner.

## 2023-05-21 ENCOUNTER — Telehealth: Payer: Self-pay | Admitting: *Deleted

## 2023-05-21 NOTE — Telephone Encounter (Signed)
Left message on f/u call 

## 2023-05-26 LAB — SURGICAL PATHOLOGY

## 2023-05-27 NOTE — Progress Notes (Signed)
Mr. Lehouillier, Only one polyp which I removed during your recent procedure was proven to be completely benign but is considered a "pre-cancerous" polyp that MAY have grown into cancer if it had not been removed.  Studies shows that at least 20% of women over age 56 and 30% of men over age 75 have pre-cancerous polyps.  Based on current nationally recognized surveillance guidelines, I recommend that you have a repeat colonoscopy in 7 years.   If you develop any new rectal bleeding, abdominal pain or significant bowel habit changes, please contact me before then.

## 2023-08-29 DIAGNOSIS — H40013 Open angle with borderline findings, low risk, bilateral: Secondary | ICD-10-CM | POA: Diagnosis not present

## 2023-12-30 ENCOUNTER — Telehealth: Payer: Self-pay

## 2023-12-30 DIAGNOSIS — E785 Hyperlipidemia, unspecified: Secondary | ICD-10-CM

## 2023-12-30 DIAGNOSIS — E291 Testicular hypofunction: Secondary | ICD-10-CM

## 2023-12-30 DIAGNOSIS — Z125 Encounter for screening for malignant neoplasm of prostate: Secondary | ICD-10-CM

## 2023-12-30 DIAGNOSIS — Z130 Encounter for screening for diseases of the blood and blood-forming organs and certain disorders involving the immune mechanism: Secondary | ICD-10-CM

## 2023-12-30 DIAGNOSIS — Z0000 Encounter for general adult medical examination without abnormal findings: Secondary | ICD-10-CM

## 2023-12-30 DIAGNOSIS — R7303 Prediabetes: Secondary | ICD-10-CM

## 2023-12-30 NOTE — Telephone Encounter (Signed)
 Copied from CRM [id number]. Topic: Clinical - Request for Lab/Test Order >> Dec 30, 2023 11:28 AM Alethia Huxley E wrote: Reason for CRM: Patient is requesting to have lab work done before his physical that is on August 6th, as this is when he would like to discuss the lab work. Callback number for patient is [phone].

## 2024-01-04 NOTE — Progress Notes (Unsigned)
  [facility] at [facility] [address] [place], [place] [address] [phone]  Date:  01/08/2024   Name:  Patrick Larsen   DOB:  03-Apr-1966   MRN:  [id number]  PCP:  Watt Harlene BROCKS, MD    Chief Complaint: No chief complaint on file.   History of Present Illness:  Patrick Larsen is a 58 y.o. very pleasant male patient who presents with the following:  Patient seen today for routine follow-up and to discuss labs.  Most recent visit with myself was last August for his physical Is Dr. Areatha will be a sophomore in high school this fall  Colon cancer screening-he had a colonoscopy in November  He was previously on testosterone  supplementation but per my knowledge has not used this recently  Patient Active Problem List   Diagnosis Date Noted   Hypogonadism in male 05/13/2020   Prediabetes 05/02/2020   Injury of left foot 03/04/2016   Chronic low back pain 08/28/2015    No past medical history on file.  Past Surgical History:  Procedure Laterality Date   FRACTURE SURGERY Left 2014   ring finger   SHOULDER ARTHROSCOPY W/ ROTATOR CUFF REPAIR Left 2020    Social History   Tobacco Use   Smoking status: Never   Smokeless tobacco: Never  Vaping Use   Vaping status: Never Used  Substance Use Topics   Alcohol use: Never   Drug use: Never    Family History  Problem Relation Age of Onset   Cancer Mother    Colon cancer Neg Hx    Esophageal cancer Neg Hx    Rectal cancer Neg Hx    Stomach cancer Neg Hx     No Known Allergies  Medication list has been reviewed and updated.  Current Outpatient Medications on File Prior to Visit  Medication Sig Dispense Refill   CALCIUM-MAGNESIUM-VITAMIN D PO Take 2 tablets by mouth daily.     Cholecalciferol (VITAMIN D3) 5000 units TABS Take 1 tablet by mouth daily.     Multiple Vitamin (MULTIVITAMIN) tablet Take 3 tablets by mouth daily.     multivitamin-lutein  (OCUVITE-LUTEIN) CAPS capsule Take 1 capsule by mouth daily.     Omega-3 Fatty Acids (THE VERY FINEST FISH OIL) LIQD Take by mouth.     Potassium 99 MG TABS Take 1 tablet by mouth daily.     Probiotic Product (ACIDOPHILUS/GOAT MILK) CAPS Take 1 each by mouth daily.     saw palmetto 500 MG capsule Take 500 mg by mouth daily.     vitamin C (ASCORBIC ACID) 500 MG tablet Take 500 mg by mouth daily.     No current facility-administered medications on file prior to visit.    Review of Systems:  As per HPI- otherwise negative.   Physical Examination: There were no vitals filed for this visit. There were no vitals filed for this visit. There is no height or weight on file to calculate BMI. Ideal Body Weight:    GEN: no acute distress. HEENT: Atraumatic, Normocephalic.  Ears and Nose: No external deformity. CV: RRR, No M/G/R. No JVD. No thrill. No extra heart sounds. PULM: CTA B, no wheezes, crackles, rhonchi. No retractions. No resp. distress. No accessory muscle use. ABD: S, NT, ND, +BS. No rebound. No HSM. EXTR: No c/c/e PSYCH: Normally interactive. Conversant.    Assessment and Plan: ***  Signed Harlene Watt, MD

## 2024-01-04 NOTE — Patient Instructions (Incomplete)
 It was good to see you today, I will be in touch with your labs

## 2024-01-08 ENCOUNTER — Encounter: Payer: Self-pay | Admitting: Family Medicine

## 2024-01-08 ENCOUNTER — Ambulatory Visit (INDEPENDENT_AMBULATORY_CARE_PROVIDER_SITE_OTHER): Admitting: Family Medicine

## 2024-01-08 VITALS — BP 134/80 | HR 74 | Ht 73.0 in | Wt 221.6 lb

## 2024-01-08 DIAGNOSIS — E785 Hyperlipidemia, unspecified: Secondary | ICD-10-CM

## 2024-01-08 DIAGNOSIS — R7303 Prediabetes: Secondary | ICD-10-CM

## 2024-01-08 DIAGNOSIS — Z130 Encounter for screening for diseases of the blood and blood-forming organs and certain disorders involving the immune mechanism: Secondary | ICD-10-CM

## 2024-01-08 DIAGNOSIS — E291 Testicular hypofunction: Secondary | ICD-10-CM | POA: Diagnosis not present

## 2024-01-08 DIAGNOSIS — Z113 Encounter for screening for infections with a predominantly sexual mode of transmission: Secondary | ICD-10-CM | POA: Diagnosis not present

## 2024-01-08 DIAGNOSIS — Z125 Encounter for screening for malignant neoplasm of prostate: Secondary | ICD-10-CM | POA: Diagnosis not present

## 2024-01-08 LAB — CBC
HCT: 44.1 % (ref 39.0–52.0)
Hemoglobin: 14.7 g/dL (ref 13.0–17.0)
MCHC: 33.4 g/dL (ref 30.0–36.0)
MCV: 88.7 fl (ref 78.0–100.0)
Platelets: 271 10*3/uL (ref 150.0–400.0)
RBC: 4.97 Mil/uL (ref 4.22–5.81)
RDW: 13.8 % (ref 11.5–15.5)
WBC: 7 10*3/uL (ref 4.0–10.5)

## 2024-01-08 LAB — LIPID PANEL
Cholesterol: 163 mg/dL (ref 0–200)
HDL: 35.7 mg/dL — ABNORMAL LOW (ref >39.00)
LDL Cholesterol: 112 mg/dL — ABNORMAL HIGH (ref 0–99)
NonHDL: 127.77
Total CHOL/HDL Ratio: 5
Triglycerides: 78 mg/dL (ref 0.0–149.0)
VLDL: 15.6 mg/dL (ref 0.0–40.0)

## 2024-01-08 LAB — COMPREHENSIVE METABOLIC PANEL WITH GFR
ALT: 30 U/L (ref 0–53)
AST: 26 U/L (ref 0–37)
Albumin: 4.6 g/dL (ref 3.5–5.2)
Alkaline Phosphatase: 67 U/L (ref 39–117)
BUN: 15 mg/dL (ref 6–23)
CO2: 31 meq/L (ref 19–32)
Calcium: 10.1 mg/dL (ref 8.4–10.5)
Chloride: 102 meq/L (ref 96–112)
Creatinine, Ser: 1.3 mg/dL (ref 0.40–1.50)
GFR: 61.13 mL/min (ref >60.00)
Glucose, Bld: 91 mg/dL (ref 70–99)
Potassium: 4.5 meq/L (ref 3.5–5.1)
Sodium: 139 meq/L (ref 135–145)
Total Bilirubin: 0.7 mg/dL (ref 0.2–1.2)
Total Protein: 7.2 g/dL (ref 6.0–8.3)

## 2024-01-08 LAB — HEMOGLOBIN A1C: Hgb A1c MFr Bld: 5.5 % (ref 4.6–6.5)

## 2024-01-08 LAB — PSA: PSA: 3.53 ng/mL (ref 0.10–4.00)

## 2024-01-09 ENCOUNTER — Ambulatory Visit: Payer: Self-pay | Admitting: Family Medicine

## 2024-01-09 LAB — HEPATITIS B SURFACE ANTIGEN: Hepatitis B Surface Ag: NONREACTIVE

## 2024-01-09 LAB — HIV ANTIBODY (ROUTINE TESTING W REFLEX): HIV 1&2 Ab, 4th Generation: NONREACTIVE

## 2024-01-09 LAB — RPR: RPR Ser Ql: NONREACTIVE

## 2024-01-09 LAB — HEPATITIS C ANTIBODY: Hepatitis C Ab: NONREACTIVE

## 2024-01-10 LAB — TESTOSTERONE,FREE AND TOTAL
Testosterone, Free: 20.6 pg/mL (ref 7.2–24.0)
Testosterone: 262 ng/dL — ABNORMAL LOW (ref 264–916)

## 2024-02-12 NOTE — Progress Notes (Deleted)
 [facility] [address], [address] [place], [place] [address] [phone]  Date:  02/18/2024   Name:  Patrick Larsen   DOB:  Dec 22, 1965   MRN:  [id number]  PCP:  Watt Harlene BROCKS, MD    Chief Complaint: No chief complaint on file.   History of Present Illness:  Patrick Larsen is a 58 y.o. very pleasant male patient who presents with the following:  Patient seen today for physical exam.  Most recent visit with myself was in June at which time we did some follow-up and labs  Divorced, his daughter Areatha is going into her sophomore year at high school  He gets plenty of exercise  Offered Shingrix Colonoscopy up-to-date We did labs in June -PSA was elevated, need to recheck today and he would also like to recheck his cholesterol Patient Active Problem List   Diagnosis Date Noted   Hypogonadism in male 05/13/2020   Prediabetes 05/02/2020   Injury of left foot 03/04/2016   Chronic low back pain 08/28/2015    No past medical history on file.  Past Surgical History:  Procedure Laterality Date   FRACTURE SURGERY Left 2014   ring finger   SHOULDER ARTHROSCOPY W/ ROTATOR CUFF REPAIR Left 2020    Social History   Tobacco Use   Smoking status: Never   Smokeless tobacco: Never  Vaping Use   Vaping status: Never Used  Substance Use Topics   Alcohol use: Never   Drug use: Never    Family History  Problem Relation Age of Onset   Cancer Mother    Colon cancer Neg Hx    Esophageal cancer Neg Hx    Rectal cancer Neg Hx    Stomach cancer Neg Hx     No Known Allergies  Medication list has been reviewed and updated.  Current Outpatient Medications on File Prior to Visit  Medication Sig Dispense Refill   CALCIUM-MAGNESIUM-VITAMIN D PO Take 2 tablets by mouth daily.     Cholecalciferol (VITAMIN D3) 5000 units TABS Take 1 tablet by mouth daily.     Multiple Vitamin (MULTIVITAMIN) tablet Take 3 tablets by mouth  daily.     multivitamin-lutein (OCUVITE-LUTEIN) CAPS capsule Take 1 capsule by mouth daily.     Omega-3 Fatty Acids (THE VERY FINEST FISH OIL) LIQD Take by mouth.     Potassium 99 MG TABS Take 1 tablet by mouth daily.     Probiotic Product (ACIDOPHILUS/GOAT MILK) CAPS Take 1 each by mouth daily.     saw palmetto 500 MG capsule Take 500 mg by mouth daily.     vitamin C (ASCORBIC ACID) 500 MG tablet Take 500 mg by mouth daily.     No current facility-administered medications on file prior to visit.    Review of Systems:  As per HPI- otherwise negative.   Physical Examination: There were no vitals filed for this visit. There were no vitals filed for this visit. There is no height or weight on file to calculate BMI. Ideal Body Weight:    GEN: no acute distress. HEENT: Atraumatic, Normocephalic.  Ears and Nose: No external deformity. CV: RRR, No M/G/R. No JVD. No thrill. No extra heart sounds. PULM: CTA B, no wheezes, crackles, rhonchi. No retractions. No resp. distress. No accessory muscle use. ABD: S, NT, ND, +BS. No rebound. No HSM. EXTR: No c/c/e PSYCH: Normally interactive. Conversant.    Assessment and Plan: ***  Signed  Harlene Schroeder, MD

## 2024-02-18 ENCOUNTER — Encounter: Payer: BC Managed Care – PPO | Admitting: Family Medicine

## 2024-02-18 DIAGNOSIS — E291 Testicular hypofunction: Secondary | ICD-10-CM

## 2024-02-18 DIAGNOSIS — Z0000 Encounter for general adult medical examination without abnormal findings: Secondary | ICD-10-CM

## 2024-02-18 DIAGNOSIS — E785 Hyperlipidemia, unspecified: Secondary | ICD-10-CM

## 2024-02-19 ENCOUNTER — Other Ambulatory Visit: Payer: Self-pay

## 2024-02-19 DIAGNOSIS — Z1211 Encounter for screening for malignant neoplasm of colon: Secondary | ICD-10-CM

## 2024-03-05 DIAGNOSIS — H40013 Open angle with borderline findings, low risk, bilateral: Secondary | ICD-10-CM | POA: Diagnosis not present
